# Patient Record
Sex: Female | Born: 1995
Health system: Southern US, Community
[De-identification: ages and names within clinical notes are randomized; demographics above are authoritative.]

## PROBLEM LIST (undated history)

## (undated) DIAGNOSIS — G43909 Migraine, unspecified, not intractable, without status migrainosus: Secondary | ICD-10-CM

## (undated) DIAGNOSIS — R350 Frequency of micturition: Secondary | ICD-10-CM

## (undated) DIAGNOSIS — L309 Dermatitis, unspecified: Secondary | ICD-10-CM

---
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
## 2015-07-08 ENCOUNTER — Emergency Department (HOSPITAL_COMMUNITY)
Admission: EM | Admit: 2015-07-08 | Discharge: 2015-07-08 | Disposition: A | Discharge status: Home / Self Care | Payer: BLUE CROSS/BLUE SHIELD | Attending: Emergency Medicine | Admitting: Emergency Medicine

## 2015-07-08 ENCOUNTER — Encounter (HOSPITAL_COMMUNITY): Payer: Self-pay

## 2015-07-08 DIAGNOSIS — J069 Acute upper respiratory infection, unspecified: Secondary | ICD-10-CM | POA: Insufficient documentation

## 2015-07-08 DIAGNOSIS — Z872 Personal history of diseases of the skin and subcutaneous tissue: Secondary | ICD-10-CM | POA: Insufficient documentation

## 2015-07-08 DIAGNOSIS — R05 Cough: Secondary | ICD-10-CM | POA: Diagnosis present

## 2015-07-08 DIAGNOSIS — Z8679 Personal history of other diseases of the circulatory system: Secondary | ICD-10-CM | POA: Insufficient documentation

## 2015-07-08 DIAGNOSIS — Z880 Allergy status to penicillin: Secondary | ICD-10-CM | POA: Insufficient documentation

## 2015-07-08 MED ORDER — GUAIFENESIN 100 MG/5ML PO LIQD: 100.0000 mg | ORAL | Status: DC | PRN

## 2015-07-08 MED ORDER — IPRATROPIUM BROMIDE 0.06 % NA SOLN: 2.0000 | Freq: Four times a day (QID) | NASAL | Status: DC

## 2015-07-08 MED ORDER — PROMETHAZINE-DM 6.25-15 MG/5ML PO SYRP: 2.5000 mL | ORAL_SOLUTION | Freq: Four times a day (QID) | ORAL | Status: DC | PRN

## 2015-07-08 NOTE — ED Notes (Signed)
Pt reports wet, non-productive cough onset Sunday and sinus infection onset Saturday. OTC meds not working.

## 2015-07-08 NOTE — ED Provider Notes (Signed)
CSN: [id number]     Arrival date & time 07/08/15  1931 History  By signing my name below, I, Elon SpannerGarrett Cook, attest that this documentation has been prepared under the direction and in the presence of Fayrene HelperBowie Camyra Vaeth, PA-C. Electronically Signed: Elon SpannerGarrett Cook ED Scribe. 07/08/2015. 7:47 PM.    Chief Complaint  Patient presents with  . Cough   The history is provided by the patient. No language interpreter was used.   HPI Comments: Katherine Rodgers is a 19 y.o. female who presents to the Emergency Department complaining of an unchanged dry cough onset 5 days ago.  Associated symptoms include scant rhinorrhea, mild voice hoarseness and sore throat.  She has taken Nyquill/Dayquill and Aleve without relief.  She reports a hx of once yearly sinus infections and concern over this brought her to the ED today.  Patient does not smoke.  She denies sinus pain, ear pain, fever, chills, SOB.     Past Medical History  Diagnosis Date  . Migraine   . Eczema   . Urinary frequency    History reviewed. No pertinent past surgical history. No family history on file. Social History  Substance Use Topics  . Smoking status: Never Smoker   . Smokeless tobacco: None  . Alcohol Use: None   OB History    No data available     Review of Systems  Constitutional: Negative for fever.  HENT: Positive for rhinorrhea, sore throat and voice change. Negative for ear pain and sinus pressure.   Respiratory: Positive for cough. Negative for shortness of breath.       Allergies  Amoxicillin  Home Medications   Prior to Admission medications   Medication Sig Start Date End Date Taking? Authorizing Provider  ipratropium (ATROVENT) 0.06 % nasal spray Place 2 sprays into both nostrils 4 (four) times daily. 07/12/14   Rodolph BongEvan S Corey, MD  Norethin-Eth Estradiol-Fe (GENERESS FE PO) Take by mouth.    Historical Provider, MD  predniSONE (DELTASONE) 10 MG tablet Take 3 tablets (30 mg total) by mouth daily. 07/12/14   Rodolph BongEvan S Corey,  MD  traMADol (ULTRAM) 50 MG tablet Take 1 tablet (50 mg total) by mouth at bedtime as needed (cough). 07/12/14   Rodolph BongEvan S Corey, MD   BP 110/75 mmHg  Pulse 79  Temp(Src) 98 F (36.7 C) (Oral)  Resp 18  Ht 5' (1.524 m)  Wt 124 lb (56.246 kg)  BMI 24.22 kg/m2  SpO2 99%  LMP 07/05/2015 Physical Exam  Constitutional: She is oriented to person, place, and time. She appears well-developed and well-nourished. No distress.  HENT:  Head: Normocephalic and atraumatic.  Nose: Nose normal.  Ears nl.  Mild post oropharyngeal erythema no tonsillar enlargement or exudates.  Uvula is midline.    Eyes: Conjunctivae and EOM are normal.  Neck: Neck supple. No tracheal deviation present.  Cardiovascular: Normal rate, regular rhythm and normal heart sounds.   Pulmonary/Chest: Effort normal and breath sounds normal. No respiratory distress. She has no wheezes. She has no rales.  Musculoskeletal: Normal range of motion.  Neurological: She is alert and oriented to person, place, and time.  Skin: Skin is warm and dry.  Psychiatric: She has a normal mood and affect. Her behavior is normal.  Nursing note and vitals reviewed.   ED Course  Procedures (including critical care time)  DIAGNOSTIC STUDIES: Oxygen Saturation is 99% on RA, normal by my interpretation.    COORDINATION OF CARE:  7:46 PM Discussed suspicion of viral etiology  with patient.  Will prescribe Dextromethorphan and guaifenesin.  Discussed advanced imaging options with patient who agreed it was not indicated at this time.  Patient acknowledges and agrees with plan.       MDM   Final diagnoses:  URI (upper respiratory infection)    BP 110/75 mmHg  Pulse 79  Temp(Src) 98 F (36.7 C) (Oral)  Resp 18  Ht 5' (1.524 m)  Wt 124 lb (56.246 kg)  BMI 24.22 kg/m2  SpO2 99%  LMP 07/05/2015   I personally performed the services described in this documentation, which was scribed in my presence. The recorded information has been  reviewed and is accurate.      Fayrene Helper, PA-C 07/08/15 1953  Fayrene Helper, PA-C 07/08/15 1610  Pricilla Loveless, MD 07/11/15 [id number]

## 2015-07-08 NOTE — Discharge Instructions (Signed)
Please take guaifenesin and promethazine as needed for your symptoms.  If no improvement, you may use atrovent nasal spray to help with nasal congestions.  Upper Respiratory Infection, Adult Most upper respiratory infections (URIs) are a viral infection of the air passages leading to the lungs. A URI affects the nose, throat, and upper air passages. The most common type of URI is nasopharyngitis and is typically referred to as "the common cold." URIs run their course and usually go away on their own. Most of the time, a URI does not require medical attention, but sometimes a bacterial infection in the upper airways can follow a viral infection. This is called a secondary infection. Sinus and middle ear infections are common types of secondary upper respiratory infections. Bacterial pneumonia can also complicate a URI. A URI can worsen asthma and chronic obstructive pulmonary disease (COPD). Sometimes, these complications can require emergency medical care and may be life threatening.  CAUSES Almost all URIs are caused by viruses. A virus is a type of germ and can spread from one person to another.  RISKS FACTORS You may be at risk for a URI if:   You smoke.   You have chronic heart or lung disease.  You have a weakened defense (immune) system.   You are very young or very old.   You have nasal allergies or asthma.  You work in crowded or poorly ventilated areas.  You work in health care facilities or schools. SIGNS AND SYMPTOMS  Symptoms typically develop 2-3 days after you come in contact with a cold virus. Most viral URIs last 7-10 days. However, viral URIs from the influenza virus (flu virus) can last 14-18 days and are typically more severe. Symptoms may include:   Runny or stuffy (congested) nose.   Sneezing.   Cough.   Sore throat.   Headache.   Fatigue.   Fever.   Loss of appetite.   Pain in your forehead, behind your eyes, and over your cheekbones (sinus  pain).  Muscle aches.  DIAGNOSIS  Your health care provider may diagnose a URI by:  Physical exam.  Tests to check that your symptoms are not due to another condition such as:  Strep throat.  Sinusitis.  Pneumonia.  Asthma. TREATMENT  A URI goes away on its own with time. It cannot be cured with medicines, but medicines may be prescribed or recommended to relieve symptoms. Medicines may help:  Reduce your fever.  Reduce your cough.  Relieve nasal congestion. HOME CARE INSTRUCTIONS   Take medicines only as directed by your health care provider.   Gargle warm saltwater or take cough drops to comfort your throat as directed by your health care provider.  Use a warm mist humidifier or inhale steam from a shower to increase air moisture. This may make it easier to breathe.  Drink enough fluid to keep your urine clear or pale yellow.   Eat soups and other clear broths and maintain good nutrition.   Rest as needed.   Return to work when your temperature has returned to normal or as your health care provider advises. You may need to stay home longer to avoid infecting others. You can also use a face mask and careful hand washing to prevent spread of the virus.  Increase the usage of your inhaler if you have asthma.   Do not use any tobacco products, including cigarettes, chewing tobacco, or electronic cigarettes. If you need help quitting, ask your health care provider. PREVENTION  The best way to protect yourself from getting a cold is to practice good hygiene.   Avoid oral or hand contact with people with cold symptoms.   Wash your hands often if contact occurs.  There is no clear evidence that vitamin C, vitamin E, echinacea, or exercise reduces the chance of developing a cold. However, it is always recommended to get plenty of rest, exercise, and practice good nutrition.  SEEK MEDICAL CARE IF:   You are getting worse rather than better.   Your symptoms are  not controlled by medicine.   You have chills.  You have worsening shortness of breath.  You have brown or red mucus.  You have yellow or brown nasal discharge.  You have pain in your face, especially when you bend forward.  You have a fever.  You have swollen neck glands.  You have pain while swallowing.  You have white areas in the back of your throat. SEEK IMMEDIATE MEDICAL CARE IF:   You have severe or persistent:  Headache.  Ear pain.  Sinus pain.  Chest pain.  You have chronic lung disease and any of the following:  Wheezing.  Prolonged cough.  Coughing up blood.  A change in your usual mucus.  You have a stiff neck.  You have changes in your:  Vision.  Hearing.  Thinking.  Mood. MAKE SURE YOU:   Understand these instructions.  Will watch your condition.  Will get help right away if you are not doing well or get worse.   This information is not intended to replace advice given to you by your health care provider. Make sure you discuss any questions you have with your health care provider.   Document Released: 02/13/2001 Document Revised: 01/04/2015 Document Reviewed: 11/25/2013 Elsevier Interactive Patient Education [employer].

## 2015-09-30 ENCOUNTER — Encounter (HOSPITAL_COMMUNITY): Payer: Self-pay | Admitting: Emergency Medicine

## 2015-09-30 ENCOUNTER — Emergency Department (INDEPENDENT_AMBULATORY_CARE_PROVIDER_SITE_OTHER)
Admission: EM | Admit: 2015-09-30 | Discharge: 2015-09-30 | Disposition: A | Discharge status: Home / Self Care | Payer: BLUE CROSS/BLUE SHIELD | Source: Home / Self Care | Attending: Family Medicine | Admitting: Family Medicine

## 2015-09-30 DIAGNOSIS — R1033 Periumbilical pain: Secondary | ICD-10-CM

## 2015-09-30 DIAGNOSIS — K5901 Slow transit constipation: Secondary | ICD-10-CM | POA: Diagnosis not present

## 2015-09-30 NOTE — Discharge Instructions (Signed)
Constipation, Adult  Use the Miralax as we discussed, 2 glasses in the evening if needed. Continue til you have empty colon. Read all of these instructions Constipation is when a person has fewer than three bowel movements a week, has difficulty having a bowel movement, or has stools that are dry, hard, or larger than normal. As people grow older, constipation is more common. A low-fiber diet, not taking in enough fluids, and taking certain medicines may make constipation worse.  CAUSES   Certain medicines, such as antidepressants, pain medicine, iron supplements, antacids, and water pills.   Certain diseases, such as diabetes, irritable bowel syndrome (IBS), thyroid disease, or depression.   Not drinking enough water.   Not eating enough fiber-rich foods.   Stress or travel.   Lack of physical activity or exercise.   Ignoring the urge to have a bowel movement.   Using laxatives too much.  SIGNS AND SYMPTOMS   Having fewer than three bowel movements a week.   Straining to have a bowel movement.   Having stools that are hard, dry, or larger than normal.   Feeling full or bloated.   Pain in the lower abdomen.   Not feeling relief after having a bowel movement.  DIAGNOSIS  Your health care provider will take a medical history and perform a physical exam. Further testing may be done for severe constipation. Some tests may include:  A barium enema X-ray to examine your rectum, colon, and, sometimes, your small intestine.   A sigmoidoscopy to examine your lower colon.   A colonoscopy to examine your entire colon. TREATMENT  Treatment will depend on the severity of your constipation and what is causing it. Some dietary treatments include drinking more fluids and eating more fiber-rich foods. Lifestyle treatments may include regular exercise. If these diet and lifestyle recommendations do not help, your health care provider may recommend taking over-the-counter  laxative medicines to help you have bowel movements. Prescription medicines may be prescribed if over-the-counter medicines do not work.  HOME CARE INSTRUCTIONS   Eat foods that have a lot of fiber, such as fruits, vegetables, whole grains, and beans.  Limit foods high in fat and processed sugars, such as french fries, hamburgers, cookies, candies, and soda.   A fiber supplement may be added to your diet if you cannot get enough fiber from foods.   Drink enough fluids to keep your urine clear or pale yellow.   Exercise regularly or as directed by your health care provider.   Go to the restroom when you have the urge to go. Do not hold it.   Only take over-the-counter or prescription medicines as directed by your health care provider. Do not take other medicines for constipation without talking to your health care provider first.  SEEK IMMEDIATE MEDICAL CARE IF:   You have bright red blood in your stool.   Your constipation lasts for more than 4 days or gets worse.   You have abdominal or rectal pain.   You have thin, pencil-like stools.   You have unexplained weight loss. MAKE SURE YOU:   Understand these instructions.  Will watch your condition.  Will get help right away if you are not doing well or get worse.   This information is not intended to replace advice given to you by your health care provider. Make sure you discuss any questions you have with your health care provider.   Document Released: 05/18/2004 Document Revised: 09/10/2014 Document Reviewed: 06/01/2013 Elsevier Interactive Patient  Education 2016 Elsevier Inc.  Abdominal Pain, Adult Many things can cause abdominal pain. Usually, abdominal pain is not caused by a disease and will improve without treatment. It can often be observed and treated at home. Your health care provider will do a physical exam and possibly order blood tests and X-rays to help determine the seriousness of your pain. However,  in many cases, more time must pass before a clear cause of the pain can be found. Before that point, your health care provider may not know if you need more testing or further treatment. HOME CARE INSTRUCTIONS Monitor your abdominal pain for any changes. The following actions may help to alleviate any discomfort you are experiencing:  Only take over-the-counter or prescription medicines as directed by your health care provider.  Do not take laxatives unless directed to do so by your health care provider.  Try a clear liquid diet (broth, tea, or water) as directed by your health care provider. Slowly move to a bland diet as tolerated. SEEK MEDICAL CARE IF:  You have unexplained abdominal pain.  You have abdominal pain associated with nausea or diarrhea.  You have pain when you urinate or have a bowel movement.  You experience abdominal pain that wakes you in the night.  You have abdominal pain that is worsened or improved by eating food.  You have abdominal pain that is worsened with eating fatty foods.  You have a fever. SEEK IMMEDIATE MEDICAL CARE IF:  Your pain does not go away within 2 hours.  You keep throwing up (vomiting).  Your pain is felt only in portions of the abdomen, such as the right side or the left lower portion of the abdomen.  You pass bloody or black tarry stools. MAKE SURE YOU:  Understand these instructions.  Will watch your condition.  Will get help right away if you are not doing well or get worse.   This information is not intended to replace advice given to you by your health care provider. Make sure you discuss any questions you have with your health care provider.   Document Released: 05/30/2005 Document Revised: 05/11/2015 Document Reviewed: 04/29/2013 Elsevier Interactive Patient Education 2016 Elsevier Inc.  High-Fiber Diet Fiber, also called dietary fiber, is a type of carbohydrate found in fruits, vegetables, whole grains, and beans. A  high-fiber diet can have many health benefits. Your health care provider may recommend a high-fiber diet to help:  Prevent constipation. Fiber can make your bowel movements more regular.  Lower your cholesterol.  Relieve hemorrhoids, uncomplicated diverticulosis, or irritable bowel syndrome.  Prevent overeating as part of a weight-loss plan.  Prevent heart disease, type 2 diabetes, and certain cancers. WHAT IS MY PLAN? The recommended daily intake of fiber includes:  38 grams for men under age 22.  30 grams for men over age 12.  25 grams for women under age 47.  21 grams for women over age 77. You can get the recommended daily intake of dietary fiber by eating a variety of fruits, vegetables, grains, and beans. Your health care provider may also recommend a fiber supplement if it is not possible to get enough fiber through your diet. WHAT DO I NEED TO KNOW ABOUT A HIGH-FIBER DIET?  Fiber supplements have not been widely studied for their effectiveness, so it is better to get fiber through food sources.  Always check the fiber content on thenutrition facts label of any prepackaged food. Look for foods that contain at least 5 grams of  fiber per serving.  Ask your dietitian if you have questions about specific foods that are related to your condition, especially if those foods are not listed in the following section.  Increase your daily fiber consumption gradually. Increasing your intake of dietary fiber too quickly may cause bloating, cramping, or gas.  Drink plenty of water. Water helps you to digest fiber. WHAT FOODS CAN I EAT? Grains Whole-grain breads. Multigrain cereal. Oats and oatmeal. Brown rice. Barley. Bulgur wheat. Millet. Bran muffins. Popcorn. Rye wafer crackers. Vegetables Sweet potatoes. Spinach. Kale. Artichokes. Cabbage. Broccoli. Green peas. Carrots. Squash. Fruits Berries. Pears. Apples. Oranges. Avocados. Prunes and raisins. Dried figs. Meats and Other  Protein Sources Navy, kidney, pinto, and soy beans. Split peas. Lentils. Nuts and seeds. Dairy Fiber-fortified yogurt. Beverages Fiber-fortified soy milk. Fiber-fortified orange juice. Other Fiber bars. The items listed above may not be a complete list of recommended foods or beverages. Contact your dietitian for more options. WHAT FOODS ARE NOT RECOMMENDED? Grains White bread. Pasta made with refined flour. White rice. Vegetables Fried potatoes. Canned vegetables. Well-cooked vegetables.  Fruits Fruit juice. Cooked, strained fruit. Meats and Other Protein Sources Fatty cuts of meat. Fried Environmental education officer or fried fish. Dairy Milk. Yogurt. Cream cheese. Sour cream. Beverages Soft drinks. Other Cakes and pastries. Butter and oils. The items listed above may not be a complete list of foods and beverages to avoid. Contact your dietitian for more information. WHAT ARE SOME TIPS FOR INCLUDING HIGH-FIBER FOODS IN MY DIET?  Eat a wide variety of high-fiber foods.  Make sure that half of all grains consumed each day are whole grains.  Replace breads and cereals made from refined flour or white flour with whole-grain breads and cereals.  Replace white rice with brown rice, bulgur wheat, or millet.  Start the day with a breakfast that is high in fiber, such as a cereal that contains at least 5 grams of fiber per serving.  Use beans in place of meat in soups, salads, or pasta.  Eat high-fiber snacks, such as berries, raw vegetables, nuts, or popcorn.   This information is not intended to replace advice given to you by your health care provider. Make sure you discuss any questions you have with your health care provider.   Document Released: 08/20/2005 Document Revised: 09/10/2014 Document Reviewed: 02/02/2014 Elsevier Interactive Patient Education [employer]

## 2015-09-30 NOTE — ED Provider Notes (Signed)
CSN: [id number]     Arrival date & time 09/30/15  1847 History   First MD Initiated Contact with Patient 09/30/15 1908     Chief Complaint  Patient presents with  . Abdominal Pain  . Constipation   (Consider location/radiation/quality/duration/timing/severity/associated sxs/prior Treatment) HPI Comments: 20 year old female presents with abdominal pain intermittently since December. She was apparently administered an unknown type of pain medicine at that time for toothache pain. She has taken the medicine periodically for toothache. Since that time she has had a cramping pain across the mid abdomen. She has bowel movements every 2-3 days. Sometime she strains and has pain with defecation. Occasionally has nausea but no vomiting. No bleeding. She is currently having her menses. The previous menses was January 1. Denies pelvic pain, vaginal discharge.  Patient is a 20 y.o. female presenting with abdominal pain and constipation.  Abdominal Pain Associated symptoms: constipation, nausea and vaginal bleeding   Associated symptoms: no fatigue, no fever, no vaginal discharge and no vomiting   Constipation Associated symptoms: abdominal pain and nausea   Associated symptoms: no fever and no vomiting     Past Medical History  Diagnosis Date  . Migraine   . Eczema   . Urinary frequency    History reviewed. No pertinent past surgical history. History reviewed. No pertinent family history. Social History  Substance Use Topics  . Smoking status: Never Smoker   . Smokeless tobacco: None  . Alcohol Use: None   OB History    No data available     Review of Systems  Constitutional: Negative for fever, activity change and fatigue.  HENT: Negative.   Respiratory: Negative.   Cardiovascular: Negative.   Gastrointestinal: Positive for nausea, abdominal pain and constipation. Negative for vomiting, blood in stool and anal bleeding.  Genitourinary: Positive for frequency and vaginal bleeding.  Negative for vaginal discharge, vaginal pain and pelvic pain.  Skin: Negative.   All other systems reviewed and are negative.   Allergies  Amoxicillin  Home Medications   Prior to Admission medications   Medication Sig Start Date End Date Taking? Authorizing Provider  Norethin-Eth Estradiol-Fe (GENERESS FE PO) Take by mouth.   Yes Historical Provider, MD  guaiFENesin (ROBITUSSIN) 100 MG/5ML liquid Take 5-10 mLs (100-200 mg total) by mouth every 4 (four) hours as needed for cough or congestion. 07/08/15   Fayrene Helper, PA-C  ipratropium (ATROVENT) 0.06 % nasal spray Place 2 sprays into both nostrils 4 (four) times daily. 07/08/15   Fayrene Helper, PA-C  predniSONE (DELTASONE) 10 MG tablet Take 3 tablets (30 mg total) by mouth daily. 07/12/14   Rodolph Bong, MD  promethazine-dextromethorphan (PROMETHAZINE-DM) 6.25-15 MG/5ML syrup Take 2.5 mLs by mouth 4 (four) times daily as needed for cough. 07/08/15   Fayrene Helper, PA-C  traMADol (ULTRAM) 50 MG tablet Take 1 tablet (50 mg total) by mouth at bedtime as needed (cough). 07/12/14   Rodolph Bong, MD   Meds Ordered and Administered this Visit  Medications - No data to display  BP 118/80 mmHg  Pulse 68  Temp(Src) 98.6 F (37 C) (Oral)  Resp 16  SpO2 100%  LMP 09/30/2015 (Exact Date) No data found.   Physical Exam  Constitutional: She is oriented to person, place, and time. She appears well-developed and well-nourished. No distress.  Eyes: EOM are normal.  Neck: Normal range of motion. Neck supple.  Cardiovascular: Normal rate, regular rhythm and normal heart sounds.   Pulmonary/Chest: Effort normal and breath sounds normal. No  respiratory distress.  Abdominal: Soft. Bowel sounds are normal.  Minor distention across the mid abdomen. The epigastrium percusses tympanic. The left hemiabdomen and periumbilical areas percuss flat her goal. No rebound or guarding. No peritoneal signs. No tenderness across the anterior pelvis.  Musculoskeletal: She  exhibits no edema.  Neurological: She is alert and oriented to person, place, and time. She exhibits normal muscle tone.  Skin: Skin is warm and dry. No rash noted.  Psychiatric: She has a normal mood and affect.  Nursing note and vitals reviewed.   ED Course  Procedures (including critical care time)  Labs Review Labs Reviewed - No data to display  Imaging Review No results found.   Visual Acuity Review  Right Eye Distance:   Left Eye Distance:   Bilateral Distance:    Right Eye Near:   Left Eye Near:    Bilateral Near:         MDM   1. Periumbilical abdominal pain   2. Constipation by delayed colonic transit    Use the Miralax as we discussed, 2 glasses in the evening if needed. Continue til you have empty colon. Read all of these instructions Increase fiber, less fast foods.    Hayden Rasmussen, NP 09/30/15 1952

## 2015-09-30 NOTE — ED Notes (Signed)
The patient presented to the [facility] with a complaint of abdominal pain and possible constipation. The patient stated that she saw her campus physician and he advised her to take a laxative. She stated that she did and the pain increased. The patient stated that she is having bowel movements.

## 2015-11-17 ENCOUNTER — Ambulatory Visit (INDEPENDENT_AMBULATORY_CARE_PROVIDER_SITE_OTHER): Payer: BLUE CROSS/BLUE SHIELD | Admitting: Certified Nurse Midwife

## 2015-11-17 ENCOUNTER — Telehealth: Payer: Self-pay | Admitting: Certified Nurse Midwife

## 2015-11-17 ENCOUNTER — Encounter: Payer: Self-pay | Admitting: Certified Nurse Midwife

## 2015-11-17 VITALS — BP 116/83 | HR 83 | Temp 98.3°F | Wt 119.0 lb

## 2015-11-17 DIAGNOSIS — Z01419 Encounter for gynecological examination (general) (routine) without abnormal findings: Secondary | ICD-10-CM | POA: Diagnosis not present

## 2015-11-17 DIAGNOSIS — Z3041 Encounter for surveillance of contraceptive pills: Secondary | ICD-10-CM

## 2015-11-17 DIAGNOSIS — N760 Acute vaginitis: Secondary | ICD-10-CM

## 2015-11-17 DIAGNOSIS — B9689 Other specified bacterial agents as the cause of diseases classified elsewhere: Secondary | ICD-10-CM

## 2015-11-17 MED ORDER — NORETHIN-ETH ESTRADIOL-FE 0.8-25 MG-MCG PO CHEW: 1.0000 | CHEWABLE_TABLET | Freq: Every day | ORAL | Status: DC

## 2015-11-17 MED ORDER — TERCONAZOLE 0.4 % VA CREA
1.0000 | TOPICAL_CREAM | Freq: Every day | VAGINAL | Status: DC
Start: 2015-11-17 — End: 2016-06-14

## 2015-11-17 MED ORDER — FLUCONAZOLE 100 MG PO TABS: 100.0000 mg | ORAL_TABLET | Freq: Once | ORAL | Status: DC

## 2015-11-17 MED ORDER — CLINDAMYCIN PHOSPHATE 100 MG VA SUPP: 100.0000 mg | Freq: Every day | VAGINAL | Status: DC

## 2015-11-17 NOTE — Progress Notes (Signed)
Patient ID: Katherine Rodgers Pothier, female   DOB: 07/30/1996, 20 y.o.   MRN: [id number]   Subjective:      Katherine Rodgers Baena is a 20 y.o. female here for a routine exam.  Current complaints:  BV, tried metrogel from campus center.  Last taken last Tuesday.  Menses are irregular, lasting 3-4 days, denies any heavy bleeding, clots or cramping.  Currently sexually active.  chewable OCP and condoms.    Sees Dr. Horris Latinoandolf for primary care.    Personal health questionnaire:  Is patient Ashkenazi Jewish, have a family history of breast and/or ovarian cancer: no Is there a family history of uterine cancer diagnosed at age < 950, gastrointestinal cancer, urinary tract cancer, family member who is a Personnel officerLynch syndrome-associated carrier: no Is the patient overweight and hypertensive, family history of diabetes, personal history of gestational diabetes, preeclampsia or PCOS: no Is patient over 7255, have PCOS,  family history of premature CHD under age 20, diabetes, smoke, have hypertension or peripheral artery disease:  yes At any time, has a partner hit, kicked or otherwise hurt or frightened you?: no Over the past 2 weeks, have you felt down, depressed or hopeless?: no Over the past 2 weeks, have you felt little interest or pleasure in doing things?:no   Gynecologic History Patient's last menstrual period was 10/28/2015. Contraception: condoms and OCP (estrogen/progesterone) Last Pap: N/A.  Last mammogram: N/A.   Obstetric History OB History  No data available    Past Medical History  Diagnosis Date  . Migraine   . Eczema   . Urinary frequency     History reviewed. No pertinent past surgical history.   Current outpatient prescriptions:  .  guaiFENesin (ROBITUSSIN) 100 MG/5ML liquid, Take 5-10 mLs (100-200 mg total) by mouth every 4 (four) hours as needed for cough or congestion., Disp: 60 mL, Rfl: 0 .  Loratadine-Pseudoephedrine (CLARITIN-D 12 HOUR PO), Take by mouth., Disp: , Rfl:  .  Norethindrone-Ethinyl  Estradiol-Fe (GENERESS FE) 0.8-25 MG-MCG tablet, Chew 1 tablet by mouth daily., Disp: 1 Package, Rfl: 11 .  clindamycin (CLEOCIN) 100 MG vaginal suppository, Place 1 suppository (100 mg total) vaginally at bedtime., Disp: 6 suppository, Rfl: 6 .  fluconazole (DIFLUCAN) 100 MG tablet, Take 1 tablet (100 mg total) by mouth once. Repeat dose in 48-72 hour., Disp: 3 tablet, Rfl: 3 .  ipratropium (ATROVENT) 0.06 % nasal spray, Place 2 sprays into both nostrils 4 (four) times daily. (Patient not taking: Reported on 11/17/2015), Disp: 15 mL, Rfl: 0 .  terconazole (TERAZOL 7) 0.4 % vaginal cream, Place 1 applicator vaginally at bedtime., Disp: 45 g, Rfl: 3 Allergies  Allergen Reactions  . Augmentin [Amoxicillin-Pot Clavulanate] Nausea And Vomiting    Social History  Substance Use Topics  . Smoking status: Never Smoker   . Smokeless tobacco: Not on file  . Alcohol Use: No    Family History  Problem Relation Age of Onset  . Hypertension Mother       Review of Systems  Constitutional: negative for fatigue and weight loss Respiratory: negative for cough and wheezing Cardiovascular: negative for chest pain, fatigue and palpitations Gastrointestinal: negative for abdominal pain and change in bowel habits Musculoskeletal:negative for myalgias Neurological: negative for gait problems and tremors Behavioral/Psych: negative for abusive relationship, depression Endocrine: negative for temperature intolerance   Genitourinary:negative for abnormal menstrual periods, genital lesions, hot flashes, sexual problems and vaginal discharge Integument/breast: negative for breast lump, breast tenderness, nipple discharge and skin lesion(s)    Objective:  BP 116/83 mmHg  Pulse 83  Temp(Src) 98.3 F (36.8 C)  Wt 119 lb (53.978 kg)  LMP 10/28/2015 General:   alert  Skin:   no rash or abnormalities  Lungs:   clear to auscultation bilaterally  Heart:   regular rate and rhythm, S1, S2 normal, no  murmur, click, rub or gallop  Breasts:   normal without suspicious masses, skin or nipple changes or axillary nodes  Abdomen:  normal findings: no organomegaly, soft, non-tender and no hernia  Pelvis:  External genitalia: normal general appearance Urinary system: urethral meatus normal and bladder without fullness, nontender Vaginal: normal without tenderness, induration or masses Cervix: normal appearance Adnexa: normal bimanual exam Uterus: anteverted and non-tender, normal size   Lab Review Urine pregnancy test Labs reviewed yes Radiologic studies reviewed no  50% of 30 min visit spent on counseling and coordination of care.   Assessment:    Healthy female exam.   BV infection  contraception management  Plan:    Education reviewed: calcium supplements, depression evaluation, low fat, low cholesterol diet, safe sex/STD prevention, self breast exams, skin cancer screening and weight bearing exercise. Contraception: condoms and OCP (estrogen/progesterone). Follow up in: 1 year.   Meds ordered this encounter  Medications  . Loratadine-Pseudoephedrine (CLARITIN-D 12 HOUR PO)    Sig: Take by mouth.  . clindamycin (CLEOCIN) 100 MG vaginal suppository    Sig: Place 1 suppository (100 mg total) vaginally at bedtime.    Dispense:  6 suppository    Refill:  6  . fluconazole (DIFLUCAN) 100 MG tablet    Sig: Take 1 tablet (100 mg total) by mouth once. Repeat dose in 48-72 hour.    Dispense:  3 tablet    Refill:  3  . terconazole (TERAZOL 7) 0.4 % vaginal cream    Sig: Place 1 applicator vaginally at bedtime.    Dispense:  45 g    Refill:  3  . Norethindrone-Ethinyl Estradiol-Fe (GENERESS FE) 0.8-25 MG-MCG tablet    Sig: Chew 1 tablet by mouth daily.    Dispense:  1 Package    Refill:  11   No orders of the defined types were placed in this encounter.   Need to obtain previous records Possible management options include: LARK Follow up as needed.

## 2015-11-17 NOTE — Telephone Encounter (Signed)
Pt was wondering how she would know if the medicine was working if she didn't have any symptoms to begin with. Please advise

## 2015-11-21 ENCOUNTER — Telehealth: Payer: Self-pay | Admitting: *Deleted

## 2015-11-21 NOTE — Telephone Encounter (Signed)
Patient has questions about how to take her medications Rachelle sent in for her. Told patient I would have Rachelle call her with instructions tomorrow. Patient is in class until 2:00 tomorrow [phone]. Did review normal vaginal flora and how over growth can happen- patient states she was asymptomatic but now has slight discharge.

## 2015-11-22 ENCOUNTER — Other Ambulatory Visit: Payer: Self-pay | Admitting: Certified Nurse Midwife

## 2015-11-22 NOTE — Telephone Encounter (Signed)
Spoke with patient regarding medications.  States that she is doing better.  Desires to use the Flagyl, Diflucan and wait on Cleocin.  Patient verbalized understanding.  R.Aeriel Boulay CNM

## 2015-11-28 ENCOUNTER — Telehealth: Payer: Self-pay | Admitting: Certified Nurse Midwife

## 2015-11-28 NOTE — Telephone Encounter (Signed)
Pt called and stated that after she finished her treatment, she still with dry mouth as a side effect. Pt will like to know what to do? Also Pt will like to know if the treatment was effective and if she is good now.  Pt appreciate if you can call her.   Katherine Rodgers

## 2015-11-29 ENCOUNTER — Other Ambulatory Visit: Payer: Self-pay | Admitting: Certified Nurse Midwife

## 2015-11-29 NOTE — Progress Notes (Signed)
Discussed POC with patient.  Patient had concerns regarding her vaginal discharge.  Just completed her period.  Denies any odor or itching.  Encouraged patient to f/u in 1 month if she feels like she is having symptoms.  Patient verbalized understanding.   R.Sheliah Fiorillo CNM

## 2016-01-05 DIAGNOSIS — J0300 Acute streptococcal tonsillitis, unspecified: Secondary | ICD-10-CM | POA: Diagnosis not present

## 2016-01-12 ENCOUNTER — Telehealth: Payer: Self-pay | Admitting: *Deleted

## 2016-01-12 NOTE — Telephone Encounter (Signed)
Patient is having BTB on OCP and wants to know if she needs to come in- call message taken by [place]. 12:30 Call to patient - patient is being treated for strep throat and she was given a course of antibiotics. Patient started having BTB after starting the antibiotic therapy. Patient wants to know if that is normal. Reviewed SE of antibiotics on OCP- encouraged patient to use BUM if she is active- which she has not been in 3 months. She also had questions about BV and yeast. Questions answered and patient satisfied.

## 2016-01-16 DIAGNOSIS — Z3009 Encounter for other general counseling and advice on contraception: Secondary | ICD-10-CM | POA: Diagnosis not present

## 2016-02-29 DIAGNOSIS — N399 Disorder of urinary system, unspecified: Secondary | ICD-10-CM | POA: Diagnosis not present

## 2016-02-29 DIAGNOSIS — R51 Headache: Secondary | ICD-10-CM | POA: Diagnosis not present

## 2016-03-22 DIAGNOSIS — R51 Headache: Secondary | ICD-10-CM | POA: Diagnosis not present

## 2016-04-03 DIAGNOSIS — Z0000 Encounter for general adult medical examination without abnormal findings: Secondary | ICD-10-CM | POA: Diagnosis not present

## 2016-04-03 DIAGNOSIS — Z01411 Encounter for gynecological examination (general) (routine) with abnormal findings: Secondary | ICD-10-CM | POA: Diagnosis not present

## 2016-04-03 DIAGNOSIS — Z3202 Encounter for pregnancy test, result negative: Secondary | ICD-10-CM | POA: Diagnosis not present

## 2016-04-03 DIAGNOSIS — Z113 Encounter for screening for infections with a predominantly sexual mode of transmission: Secondary | ICD-10-CM | POA: Diagnosis not present

## 2016-05-21 DIAGNOSIS — H6690 Otitis media, unspecified, unspecified ear: Secondary | ICD-10-CM | POA: Diagnosis not present

## 2016-05-23 DIAGNOSIS — J0300 Acute streptococcal tonsillitis, unspecified: Secondary | ICD-10-CM | POA: Diagnosis not present

## 2016-06-02 DIAGNOSIS — H60311 Diffuse otitis externa, right ear: Secondary | ICD-10-CM | POA: Diagnosis not present

## 2016-06-13 ENCOUNTER — Telehealth: Payer: Self-pay

## 2016-06-13 NOTE — Telephone Encounter (Signed)
S/w the patient about menstrual cycle while on birth control and antibiotics. Patient stated that she would like to come in and speak to provider about medication. Transferred to make an appt.

## 2016-06-14 ENCOUNTER — Ambulatory Visit (INDEPENDENT_AMBULATORY_CARE_PROVIDER_SITE_OTHER): Payer: BLUE CROSS/BLUE SHIELD | Admitting: Obstetrics

## 2016-06-14 ENCOUNTER — Encounter: Payer: Self-pay | Admitting: Obstetrics

## 2016-06-14 VITALS — BP 116/83 | HR 79 | Ht 60.0 in | Wt 118.0 lb

## 2016-06-14 DIAGNOSIS — N939 Abnormal uterine and vaginal bleeding, unspecified: Secondary | ICD-10-CM

## 2016-06-14 DIAGNOSIS — Z3202 Encounter for pregnancy test, result negative: Secondary | ICD-10-CM

## 2016-06-14 DIAGNOSIS — Z113 Encounter for screening for infections with a predominantly sexual mode of transmission: Secondary | ICD-10-CM | POA: Diagnosis not present

## 2016-06-14 LAB — POCT URINE PREGNANCY: Preg Test, Ur: NEGATIVE

## 2016-06-15 ENCOUNTER — Encounter: Payer: Self-pay | Admitting: Obstetrics

## 2016-06-15 NOTE — Progress Notes (Signed)
Patient ID: Cory MunchLasha Janice, female   DOB: 05/27/1996, 20 y.o.   MRN: [id number]  Chief Complaint  Patient presents with  . Menstrual Problem    HPI Cory MunchLasha Files is a 20 y.o. female.  Recent OCP start and c/o AUB with 1st cycle. HPI  Past Medical History:  Diagnosis Date  . Eczema   . Migraine   . Urinary frequency     History reviewed. No pertinent surgical history.  Family History  Problem Relation Age of Onset  . Hypertension Mother     Social History Social History  Substance Use Topics  . Smoking status: Never Smoker  . Smokeless tobacco: Not on file  . Alcohol use No    Allergies  Allergen Reactions  . Augmentin [Amoxicillin-Pot Clavulanate] Nausea And Vomiting    Current Outpatient Prescriptions  Medication Sig Dispense Refill  . Norethindrone-Ethinyl Estradiol-Fe (GENERESS FE) 0.8-25 MG-MCG tablet Chew 1 tablet by mouth daily. 1 Package 11   No current facility-administered medications for this visit.     Review of Systems Review of Systems Constitutional: negative for fatigue and weight loss Respiratory: negative for cough and wheezing Cardiovascular: negative for chest pain, fatigue and palpitations Gastrointestinal: negative for abdominal pain and change in bowel habits Genitourinary:positive for AUB on OCP's Integument/breast: negative for nipple discharge Musculoskeletal:negative for myalgias Neurological: negative for gait problems and tremors Behavioral/Psych: negative for abusive relationship, depression Endocrine: negative for temperature intolerance     Blood pressure 116/83, pulse 79, height 5' (1.524 m), weight 118 lb (53.5 kg), last menstrual period 06/08/2016.  Physical Exam Physical Exam General:   alert  Skin:   no rash or abnormalities  Lungs:   clear to auscultation bilaterally  Heart:   regular rate and rhythm, S1, S2 normal, no murmur, click, rub or gallop  Breasts:   normal without suspicious masses, skin or nipple changes or  axillary nodes  Abdomen:  normal findings: no organomegaly, soft, non-tender and no hernia  Pelvis:  External genitalia: normal general appearance Urinary system: urethral meatus normal and bladder without fullness, nontender Vaginal: normal without tenderness, induration or masses Cervix: normal appearance Adnexa: normal bimanual exam Uterus: anteverted and non-tender, normal size    50% of 15 min visit spent on counseling and coordination of care.   Data Reviewed Labs  Assessment     AUB - Hormonal    Plan    Wet prep and GC/Chlamydia cultures done Continue OCP's F/U 6 weeks  Orders Placed This Encounter  Procedures  . POCT urine pregnancy   No orders of the defined types were placed in this encounter.

## 2016-06-18 LAB — CERVICOVAGINAL ANCILLARY ONLY
CHLAMYDIA, DNA PROBE: NEGATIVE
NEISSERIA GONORRHEA: NEGATIVE
TRICH (WINDOWPATH): NEGATIVE

## 2016-06-20 LAB — CERVICOVAGINAL ANCILLARY ONLY
BACTERIAL VAGINITIS: NEGATIVE
Candida vaginitis: NEGATIVE

## 2016-07-30 ENCOUNTER — Institutional Professional Consult (permissible substitution): Payer: BLUE CROSS/BLUE SHIELD | Admitting: Obstetrics

## 2016-08-07 ENCOUNTER — Ambulatory Visit (INDEPENDENT_AMBULATORY_CARE_PROVIDER_SITE_OTHER): Payer: BLUE CROSS/BLUE SHIELD | Admitting: Obstetrics

## 2016-08-07 ENCOUNTER — Encounter: Payer: Self-pay | Admitting: Obstetrics

## 2016-08-07 VITALS — BP 97/72 | HR 90 | Temp 98.2°F | Wt 118.9 lb

## 2016-08-07 DIAGNOSIS — N898 Other specified noninflammatory disorders of vagina: Secondary | ICD-10-CM | POA: Diagnosis not present

## 2016-08-07 DIAGNOSIS — N939 Abnormal uterine and vaginal bleeding, unspecified: Secondary | ICD-10-CM | POA: Diagnosis not present

## 2016-08-07 DIAGNOSIS — Z113 Encounter for screening for infections with a predominantly sexual mode of transmission: Secondary | ICD-10-CM | POA: Diagnosis not present

## 2016-08-08 ENCOUNTER — Encounter: Payer: Self-pay | Admitting: Obstetrics

## 2016-08-08 LAB — HEPATITIS B SURFACE ANTIGEN: Hepatitis B Surface Ag: NEGATIVE

## 2016-08-08 LAB — RPR: RPR: NONREACTIVE

## 2016-08-08 LAB — HEPATITIS C ANTIBODY: HEP C VIRUS AB: 0.1 {s_co_ratio} (ref 0.0–0.9)

## 2016-08-08 LAB — HIV ANTIBODY (ROUTINE TESTING W REFLEX): HIV Screen 4th Generation wRfx: NONREACTIVE

## 2016-08-08 NOTE — Progress Notes (Signed)
Subjective:    Katherine Rodgers is a 20 y.o. female who presents for contraception counseling. The patient has history of irregular bleeding with OCP's. The patient is sexually active. Pertinent past medical history: none.  The information documented in the HPI was reviewed and verified.  Menstrual History: OB History    No data available      Patient's last menstrual period was 08/03/2016 (approximate).   Patient Active Problem List   Diagnosis Date Noted  . Well woman exam 11/17/2015   Past Medical History:  Diagnosis Date  . Eczema   . Migraine   . Urinary frequency     History reviewed. No pertinent surgical history.   Current Outpatient Prescriptions:  .  Norethindrone-Ethinyl Estradiol-Fe (GENERESS FE) 0.8-25 MG-MCG tablet, Chew 1 tablet by mouth daily., Disp: 1 Package, Rfl: 11 Allergies  Allergen Reactions  . Augmentin [Amoxicillin-Pot Clavulanate] Nausea And Vomiting    Social History  Substance Use Topics  . Smoking status: Never Smoker  . Smokeless tobacco: Never Used  . Alcohol use No    Family History  Problem Relation Age of Onset  . Hypertension Mother        Review of Systems Constitutional: negative for weight loss Genitourinary:negative for abnormal menstrual periods and vaginal discharge   Objective:   BP 97/72   Pulse 90   Temp 98.2 F (36.8 C) (Oral)   Wt 118 lb 14.4 oz (53.9 kg)   LMP 08/03/2016 (Approximate)   BMI 23.22 kg/m    General:   alert  Skin:   no rash or abnormalities  Lungs:   clear to auscultation bilaterally  Heart:   regular rate and rhythm, S1, S2 normal, no murmur, click, rub or gallop  Breasts:   normal without suspicious masses, skin or nipple changes or axillary nodes  Abdomen:  normal findings: no organomegaly, soft, non-tender and no hernia  Pelvis:  External genitalia: normal general appearance Urinary system: urethral meatus normal and bladder without fullness, nontender Vaginal: normal without tenderness,  induration or masses Cervix: normal appearance Adnexa: normal bimanual exam Uterus: anteverted and non-tender, normal size   Lab Review Urine pregnancy test Labs reviewed yes Radiologic studies reviewed no  50% of 15 min visit spent on counseling and coordination of care.    Assessment:    20 y.o., continuing OCP (estrogen/progesterone), no contraindications.   Plan:    All questions answered. Chlamydia specimen. Contraception: OCP (estrogen/progesterone). Follow up in 4 months. GC specimen. Wet prep.    No orders of the defined types were placed in this encounter.  Orders Placed This Encounter  Procedures  . HIV antibody  . Hepatitis B surface antigen  . RPR  . Hepatitis C antibody

## 2016-08-09 ENCOUNTER — Institutional Professional Consult (permissible substitution): Payer: BLUE CROSS/BLUE SHIELD | Admitting: Obstetrics

## 2016-08-10 ENCOUNTER — Telehealth: Payer: Self-pay

## 2016-08-10 NOTE — Telephone Encounter (Signed)
Patient called in for lab results  ?

## 2016-08-13 LAB — NUSWAB VG+, CANDIDA 6SP
Candida albicans, NAA: NEGATIVE
Candida glabrata, NAA: NEGATIVE
Candida krusei, NAA: NEGATIVE
Candida lusitaniae, NAA: NEGATIVE
Candida parapsilosis, NAA: NEGATIVE
Candida tropicalis, NAA: NEGATIVE
Chlamydia trachomatis, NAA: NEGATIVE
NEISSERIA GONORRHOEAE, NAA: NEGATIVE
Trich vag by NAA: NEGATIVE

## 2016-09-07 DIAGNOSIS — L700 Acne vulgaris: Secondary | ICD-10-CM | POA: Diagnosis not present

## 2016-09-25 ENCOUNTER — Ambulatory Visit (INDEPENDENT_AMBULATORY_CARE_PROVIDER_SITE_OTHER): Payer: BLUE CROSS/BLUE SHIELD

## 2016-09-25 ENCOUNTER — Ambulatory Visit (INDEPENDENT_AMBULATORY_CARE_PROVIDER_SITE_OTHER): Payer: BLUE CROSS/BLUE SHIELD | Admitting: Family Medicine

## 2016-09-25 VITALS — BP 102/76 | HR 81 | Temp 97.9°F | Resp 16 | Ht 59.0 in | Wt 118.6 lb

## 2016-09-25 DIAGNOSIS — M79632 Pain in left forearm: Secondary | ICD-10-CM | POA: Diagnosis not present

## 2016-09-25 DIAGNOSIS — M549 Dorsalgia, unspecified: Secondary | ICD-10-CM

## 2016-09-25 DIAGNOSIS — M545 Low back pain: Secondary | ICD-10-CM | POA: Diagnosis not present

## 2016-09-25 DIAGNOSIS — M79605 Pain in left leg: Secondary | ICD-10-CM | POA: Diagnosis not present

## 2016-09-25 DIAGNOSIS — M79602 Pain in left arm: Secondary | ICD-10-CM

## 2016-09-25 DIAGNOSIS — S4992XA Unspecified injury of left shoulder and upper arm, initial encounter: Secondary | ICD-10-CM | POA: Diagnosis not present

## 2016-09-25 DIAGNOSIS — S59912A Unspecified injury of left forearm, initial encounter: Secondary | ICD-10-CM | POA: Diagnosis not present

## 2016-09-25 DIAGNOSIS — S79912A Unspecified injury of left hip, initial encounter: Secondary | ICD-10-CM | POA: Diagnosis not present

## 2016-09-25 DIAGNOSIS — M25552 Pain in left hip: Secondary | ICD-10-CM | POA: Diagnosis not present

## 2016-09-25 MED ORDER — NAPROXEN 500 MG PO TABS: 500.0000 mg | ORAL_TABLET | Freq: Two times a day (BID) | ORAL | 0 refills | Status: DC

## 2016-09-25 MED ORDER — PREDNISONE 20 MG PO TABS: ORAL_TABLET | ORAL | 0 refills | Status: DC

## 2016-09-25 MED ORDER — TRAMADOL HCL 50 MG PO TABS: 50.0000 mg | ORAL_TABLET | Freq: Three times a day (TID) | ORAL | 0 refills | Status: DC | PRN

## 2016-09-25 NOTE — Progress Notes (Signed)
Patient ID: Katherine Rodgers, female    DOB: 06-16-1996, 20 y.o.   MRN: [id number]  PCP: Pcp Not In System  Chief Complaint  Patient presents with  . Arm Pain    from a fall on ice yesterday  . Leg Pain    Subjective:  HPI 21 year old female presents for evaluation of arm pain and leg pain x 1 day. Reports that she was walking and slipped on ice falling on her left hip and left arm. Experienced a immediate pain after fall.  Report some tingling of the lower forearm. Pain is most pronounced in lateral left thigh. HTook an 800 mg ibupofen without releif of pain  Lower back is also starting to hurt more now. Back is stiff and denies muscle spasms.  Social History   Social History  . Marital status: Single    Spouse name: N/A  . Number of children: N/A  . Years of education: N/A   Occupational History  . Not on file.   Social History Main Topics  . Smoking status: Never Smoker  . Smokeless tobacco: Never Used  . Alcohol use No  . Drug use: No  . Sexual activity: Yes    Partners: Male    Birth control/ protection: Pill   Other Topics Concern  . Not on file   Social History Narrative  . No narrative on file    Family History  Problem Relation Age of Onset  . Hypertension Mother    Review of Systems See HPI  Prior to Admission medications   Medication Sig Start Date End Date Taking? Authorizing Provider  Norethindrone-Ethinyl Estradiol-Fe (GENERESS FE) 0.8-25 MG-MCG tablet Chew 1 tablet by mouth daily. 11/17/15  Yes Roe Coombs, CNM    Past Medical, Surgical Family and Social History reviewed and updated.    Objective:   Today's Vitals   09/25/16 1546  BP: 102/76  Pulse: 81  Resp: 16  Temp: 97.9 F (36.6 C)  TempSrc: Oral  SpO2: 100%  Weight: 118 lb 9.6 oz (53.8 kg)  Height: 4\' 11"  (1.499 m)    Wt Readings from Last 3 Encounters:  09/25/16 118 lb 9.6 oz (53.8 kg)  08/07/16 118 lb 14.4 oz (53.9 kg)  06/14/16 118 lb (53.5 kg)    Physical  Exam  Constitutional: She is oriented to person, place, and time. She appears well-developed and well-nourished.  Cardiovascular: Normal rate, regular rhythm, normal heart sounds and intact distal pulses.   Pulmonary/Chest: Effort normal and breath sounds normal.  Musculoskeletal: She exhibits tenderness. She exhibits no edema.  Complete ROM of left upper and lower extremity with pronounced tenderness.   Neurological: She is alert and oriented to person, place, and time.  Skin: Skin is warm and dry.  Psychiatric: She has a normal mood and affect. Her behavior is normal. Judgment and thought content normal.   Dg Lumbar Spine Complete  Result Date: 09/25/2016 CLINICAL DATA:  Fall on ice.  Low back pain.  Initial encounter. EXAM: LUMBAR SPINE - COMPLETE 4+ VIEW COMPARISON:  None. FINDINGS: There is no evidence of lumbar spine fracture. Alignment is normal. Intervertebral disc spaces are maintained. No evidence of facet arthropathy or other focal bone lesions. IMPRESSION: Negative. Electronically Signed   By: Myles Rosenthal M.D.   On: 09/25/2016 17:13   Dg Forearm Left  Result Date: 09/25/2016 CLINICAL DATA:  Fall, pain. EXAM: LEFT FOREARM - 2 VIEW COMPARISON:  None. FINDINGS: There is no evidence of fracture or other  focal bone lesions. Soft tissues are unremarkable. IMPRESSION: Negative. Electronically Signed   By: Charlett NoseKevin  Dover M.D.   On: 09/25/2016 17:05   Dg Humerus Left  Result Date: 09/25/2016 CLINICAL DATA:  Patient fell on pavement after slipping on ice. EXAM: LEFT HUMERUS - 2+ VIEW COMPARISON:  None. FINDINGS: There is no evidence of fracture or other focal bone lesions. Soft tissues are unremarkable. IMPRESSION: Negative. Electronically Signed   By: Kennith CenterEric  Mansell M.D.   On: 09/25/2016 17:06   Dg Hip Unilat W Or W/o Pelvis 2-3 Views Left  Result Date: 09/25/2016 CLINICAL DATA:  Slip and fall with left hip pain, initial encounter EXAM: DG HIP (WITH OR WITHOUT PELVIS) 2-3V LEFT COMPARISON:   None. FINDINGS: Pelvic ring is intact. No acute fracture or dislocation is noted. No soft tissue changes are seen. IMPRESSION: No acute abnormality noted. Electronically Signed   By: Alcide CleverMark  Lukens M.D.   On: 09/25/2016 17:14      Assessment & Plan:  1. Pain of left upper extremity 2. Back pain, unspecified back location, unspecified back pain laterality, unspecified chronicity 3. Pain of left lower extremity  Plan: Take Naproxen 500 mg twice daily with food x 14 days to relieve inflammation. Return in two weeks if pain has not improved or worsens.   Godfrey PickKimberly S. Tiburcio PeaHarris, MSN, FNP-C [facility] [phone]

## 2016-09-25 NOTE — Patient Instructions (Addendum)
Take Naproxen 500 mg twice daily with food x 14 days to relieve inflammation.  Return in two weeks if pain has not improved or worsens.   IF you received an x-ray today, you will receive an invoice from [facility]. Please contact [facility] at [phone] with questions or concerns regarding your invoice.   IF you received labwork today, you will receive an invoice from [facility]. Please contact [facility] at [phone] with questions or concerns regarding your invoice.   Our billing staff will not be able to assist you with questions regarding bills from these companies.  You will be contacted with the lab results as soon as they are available. The fastest way to get your results is to activate your My Chart account. Instructions are located on the last page of this paperwork. If you have not heard from us regarding the results in 2 weeks, please contact this office.      Triceps Tendinitis Rehab Ask your health care provider which exercises are safe for you. Do exercises exactly as told by your health care provider and adjust them as directed. It is normal to feel mild stretching, pulling, tightness, or discomfort as you do these exercises, but you should stop right away if you feel sudden pain or your pain gets worse. Do not begin these exercises until told by your health care provider. Stretching and range of motion exercises These exercises warm up your muscles and joints and improve the movement and flexibility of your arm. These exercises can also help to relieve pain, numbness, and tingling. Exercise A: Elbow flexion, passive 1. Stand or sit with your left / right arm at your side. 2. Gently push your left / right arm toward your shoulder using your other hand. Bend your elbow as far as your health care provider tells you to. You may be instructed to try to bend your elbow more and more each week. 3. Hold for __________ seconds. 4. Slowly return to the starting  position. Repeat __________ times. Complete this exercise __________ times a day. Exercise B: Elbow extension, passive, supine 1. Lie on your back on a firm surface. 2. Place a folded towel under your left / right upper arm, so your elbow and shoulder are at the same height and your elbow does not touch the surface that you are lying on. 3. Move your left / right arm out to your side, keeping your elbow bent. 4. Slowly straighten your elbow until you feel a stretch on the inside of your elbow. Keep your arm and chest muscles relaxed. 5. Hold for __________ seconds. 6. Slowly return to the starting position. Repeat __________ times. Complete this exercise __________ times a day. Strengthening exercises These exercises build strength and endurance in your arm. Endurance is the ability to use your muscles for a long time, even after they get tired. Exercise C: Elbow flexion, supinated 1. Sit on a stable chair without armrests, or stand. 2. Hold a __________ weight in your left / right hand, or hold an exercise band with both hands. You palms should face up toward the ceiling at the starting position. 3. Bend your left / right elbow and move your hand up toward your shoulder. Keep your other arm straight down, in the starting position. 4. Slowly return to the starting position. Repeat __________ times. Complete this exercise __________ times a day. Exercise D: Elbow extension 1. Lie on your back. 2. If instructed, hold a __________ weight in your left / right hand.  3. Bend your left / right elbow to an "L" shape (90 degrees) so your elbow is pointed up toward the ceiling and the weight is over your head. 4. Straighten your left / right elbow, raising your hand toward the ceiling. Use your other hand to support your left / right upper arm and to keep it still. 5. Slowly return to the starting position. Repeat __________ times. Complete this exercise __________ times a day. Exercise E: Scapular  retraction 1. Sit in a stable chair without arms, or stand. 2. Secure an exercise band to a stable object in front of you so the band is at shoulder height. 3. Hold one end of the exercise band in each hand. 4. Squeeze your shoulder blades together and move your elbows slightly behind you. Do not shrug your shoulders. 5. Hold for __________ seconds. 6. Slowly return to the starting position. Repeat __________ times. Complete this exercise __________ times a day. This information is not intended to replace advice given to you by your health care provider. Make sure you discuss any questions you have with your health care provider. Document Released: 08/20/2005 Document Revised: 04/26/2016 Document Reviewed: 07/29/2015 Elsevier Interactive Patient Education  2017 [employer].    Hip Pain Your hip is the joint between your upper legs and your lower pelvis. The bones, cartilage, tendons, and muscles of your hip joint perform a lot of work each day supporting your body weight and allowing you to move around. Hip pain can range from a minor ache to severe pain in one or both of your hips. Pain may be felt on the inside of the hip joint near the groin, or the outside near the buttocks and upper thigh. You may have swelling or stiffness as well. Follow these instructions at home:  Take medicines only as directed by your health care provider.  Apply ice to the injured area:  Put ice in a plastic bag.  Place a towel between your skin and the bag.  Leave the ice on for 15-20 minutes at a time, 3-4 times a day.  Keep your leg raised (elevated) when possible to lessen swelling.  Avoid activities that cause pain.  Follow specific exercises as directed by your health care provider.  Sleep with a pillow between your legs on your most comfortable side.  Record how often you have hip pain, the location of the pain, and what it feels like. Contact a health care provider if:  You are unable to  put weight on your leg.  Your hip is red or swollen or very tender to touch.  Your pain or swelling continues or worsens after 1 week.  You have increasing difficulty walking.  You have a fever. Get help right away if:  You have fallen.  You have a sudden increase in pain and swelling in your hip. This information is not intended to replace advice given to you by your health care provider. Make sure you discuss any questions you have with your health care provider. Document Released: 02/07/2010 Document Revised: 01/26/2016 Document Reviewed: 04/16/2013 Elsevier Interactive Patient Education  2017 [employer] Inc.  Back Exercises Introduction If you have pain in your back, do these exercises 2-3 times each day or as told by your doctor. When the pain goes away, do the exercises once each day, but repeat the steps more times for each exercise (do more repetitions). If you do not have pain in your back, do these exercises once each day or as told  by your doctor. Exercises Single Knee to Chest  Do these steps 3-5 times in a row for each leg: 5. Lie on your back on a firm bed or the floor with your legs stretched out. 6. Bring one knee to your chest. 7. Hold your knee to your chest by grabbing your knee or thigh. 8. Pull on your knee until you feel a gentle stretch in your lower back. 9. Keep doing the stretch for 10-30 seconds. 10. Slowly let go of your leg and straighten it. Pelvic Tilt  Do these steps 5-10 times in a row: 7. Lie on your back on a firm bed or the floor with your legs stretched out. 8. Bend your knees so they point up to the ceiling. Your feet should be flat on the floor. 9. Tighten your lower belly (abdomen) muscles to press your lower back against the floor. This will make your tailbone point up to the ceiling instead of pointing down to your feet or the floor. 10. Stay in this position for 5-10 seconds while you gently tighten your muscles and breathe evenly. Cat-Cow   Do these steps until your lower back bends more easily: 5. Get on your hands and knees on a firm surface. Keep your hands under your shoulders, and keep your knees under your hips. You may put padding under your knees. 6. Let your head hang down, and make your tailbone point down to the floor so your lower back is round like the back of a cat. 7. Stay in this position for 5 seconds. 8. Slowly lift your head and make your tailbone point up to the ceiling so your back hangs low (sags) like the back of a cow. 9. Stay in this position for 5 seconds. Press-Ups  Do these steps 5-10 times in a row: 6. Lie on your belly (face-down) on the floor. 7. Place your hands near your head, about shoulder-width apart. 8. While you keep your back relaxed and keep your hips on the floor, slowly straighten your arms to raise the top half of your body and lift your shoulders. Do not use your back muscles. To make yourself more comfortable, you may change where you place your hands. 9. Stay in this position for 5 seconds. 10. Slowly return to lying flat on the floor. Bridges  Do these steps 10 times in a row: 7. Lie on your back on a firm surface. 8. Bend your knees so they point up to the ceiling. Your feet should be flat on the floor. 9. Tighten your butt muscles and lift your butt off of the floor until your waist is almost as high as your knees. If you do not feel the muscles working in your butt and the back of your thighs, slide your feet 1-2 inches farther away from your butt. 10. Stay in this position for 3-5 seconds. 11. Slowly lower your butt to the floor, and let your butt muscles relax. If this exercise is too easy, try doing it with your arms crossed over your chest. Belly Crunches  Do these steps 5-10 times in a row: 1. Lie on your back on a firm bed or the floor with your legs stretched out. 2. Bend your knees so they point up to the ceiling. Your feet should be flat on the floor. 3. Cross your  arms over your chest. 4. Tip your chin a little bit toward your chest but do not bend your neck. 5. Tighten your belly muscles and  slowly raise your chest just enough to lift your shoulder blades a tiny bit off of the floor. 6. Slowly lower your chest and your head to the floor. Back Lifts  Do these steps 5-10 times in a row: 1. Lie on your belly (face-down) with your arms at your sides, and rest your forehead on the floor. 2. Tighten the muscles in your legs and your butt. 3. Slowly lift your chest off of the floor while you keep your hips on the floor. Keep the back of your head in line with the curve in your back. Look at the floor while you do this. 4. Stay in this position for 3-5 seconds. 5. Slowly lower your chest and your face to the floor. Contact a doctor if:  Your back pain gets a lot worse when you do an exercise.  Your back pain does not lessen 2 hours after you exercise. If you have any of these problems, stop doing the exercises. Do not do them again unless your doctor says it is okay. Get help right away if:  You have sudden, very bad back pain. If this happens, stop doing the exercises. Do not do them again unless your doctor says it is okay. This information is not intended to replace advice given to you by your health care provider. Make sure you discuss any questions you have with your health care provider. Document Released: 09/22/2010 Document Revised: 01/26/2016 Document Reviewed: 10/14/2014  2017 Elsevier

## 2016-10-19 ENCOUNTER — Ambulatory Visit (INDEPENDENT_AMBULATORY_CARE_PROVIDER_SITE_OTHER): Payer: BLUE CROSS/BLUE SHIELD | Admitting: Family Medicine

## 2016-10-19 VITALS — BP 112/64 | HR 86 | Temp 98.4°F | Ht 59.0 in | Wt 118.4 lb

## 2016-10-19 DIAGNOSIS — Z5321 Procedure and treatment not carried out due to patient leaving prior to being seen by health care provider: Secondary | ICD-10-CM

## 2016-10-19 DIAGNOSIS — J069 Acute upper respiratory infection, unspecified: Secondary | ICD-10-CM | POA: Diagnosis not present

## 2016-10-19 NOTE — Patient Instructions (Signed)
     IF you received an x-ray today, you will receive an invoice from [facility]. Please contact  Radiology at [phone] with questions or concerns regarding your invoice.   IF you received labwork today, you will receive an invoice from [facility]. Please contact [facility] at [phone] with questions or concerns regarding your invoice.   Our billing staff will not be able to assist you with questions regarding bills from these companies.  You will be contacted with the lab results as soon as they are available. The fastest way to get your results is to activate your My Chart account. Instructions are located on the last page of this paperwork. If you have not heard from us regarding the results in 2 weeks, please contact this office.     

## 2016-10-19 NOTE — Progress Notes (Signed)
Left without being seen.

## 2016-10-26 ENCOUNTER — Telehealth: Payer: Self-pay
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
## 2016-10-29 DIAGNOSIS — Z113 Encounter for screening for infections with a predominantly sexual mode of transmission: Secondary | ICD-10-CM | POA: Diagnosis not present

## 2016-10-29 DIAGNOSIS — Z3202 Encounter for pregnancy test, result negative: Secondary | ICD-10-CM | POA: Diagnosis not present

## 2016-10-29 DIAGNOSIS — N926 Irregular menstruation, unspecified: Secondary | ICD-10-CM | POA: Diagnosis not present

## 2016-10-29 DIAGNOSIS — Z118 Encounter for screening for other infectious and parasitic diseases: Secondary | ICD-10-CM | POA: Diagnosis not present

## 2016-11-01 DIAGNOSIS — L700 Acne vulgaris: Secondary | ICD-10-CM | POA: Diagnosis not present

## 2016-11-19 DIAGNOSIS — Z113 Encounter for screening for infections with a predominantly sexual mode of transmission: Secondary | ICD-10-CM | POA: Diagnosis not present

## 2016-11-21 DIAGNOSIS — N390 Urinary tract infection, site not specified: Secondary | ICD-10-CM | POA: Diagnosis not present

## 2016-11-28 DIAGNOSIS — Z202 Contact with and (suspected) exposure to infections with a predominantly sexual mode of transmission: Secondary | ICD-10-CM | POA: Diagnosis not present

## 2016-11-28 DIAGNOSIS — N390 Urinary tract infection, site not specified: Secondary | ICD-10-CM | POA: Diagnosis not present

## 2016-12-03 ENCOUNTER — Ambulatory Visit (INDEPENDENT_AMBULATORY_CARE_PROVIDER_SITE_OTHER): Payer: BLUE CROSS/BLUE SHIELD | Admitting: Obstetrics & Gynecology

## 2016-12-03 ENCOUNTER — Encounter: Payer: Self-pay | Admitting: Obstetrics & Gynecology

## 2016-12-03 VITALS — Ht 60.0 in | Wt 118.1 lb

## 2016-12-03 DIAGNOSIS — N898 Other specified noninflammatory disorders of vagina: Secondary | ICD-10-CM | POA: Diagnosis not present

## 2016-12-03 DIAGNOSIS — L989 Disorder of the skin and subcutaneous tissue, unspecified: Secondary | ICD-10-CM

## 2016-12-03 DIAGNOSIS — Z113 Encounter for screening for infections with a predominantly sexual mode of transmission: Secondary | ICD-10-CM | POA: Diagnosis not present

## 2016-12-03 NOTE — Patient Instructions (Signed)
Return to clinic for any scheduled appointments or for any gynecologic concerns as needed.   

## 2016-12-03 NOTE — Progress Notes (Signed)
   GYNECOLOGY OFFICE VISIT NOTE  History:  21 y.o. G0P0000 here today for evaluation of bump on left inner thigh.  Was evaluated at outside center and had a blood test positive for HSV. She is concerned she has active HSV; never hadan outbreak. Boyfriend does not have HSV, as far as she knows.  Bump is mildly tender, but also reports mild vulvar irritation. History of oral fever blisters when she was younger. She denies any abnormal vaginal discharge, bleeding, pelvic pain or other concerns.   Past Medical History:  Diagnosis Date  . Eczema   . Migraine   . Urinary frequency     History reviewed. No pertinent surgical history.  The following portions of the patient's history were reviewed and updated as appropriate: allergies, current medications, past family history, past medical history, past social history, past surgical history and problem list.    Review of Systems:  Pertinent items noted in HPI and remainder of comprehensive ROS otherwise negative.   Objective:  Physical Exam Ht 5' (1.524 m)   Wt 118 lb 1.6 oz (53.6 kg)   BMI 23.06 kg/m  CONSTITUTIONAL: Well-developed, well-nourished female in no acute distress.  HENT:  Normocephalic, atraumatic. External right and left ear normal. Oropharynx is clear and moist EYES: Conjunctivae and EOM are normal. Pupils are equal, round, and reactive to light. No scleral icterus.  NECK: Normal range of motion, supple, no masses SKIN: Skin is warm and dry. No rash noted. Not diaphoretic. No erythema. No pallor. NEUROLOGIC: Alert and oriented to person, place, and time. Normal reflexes, muscle tone coordination. No cranial nerve deficit noted. PSYCHIATRIC: Normal mood and affect. Normal behavior. Normal judgment and thought content. CARDIOVASCULAR: Normal heart rate noted RESPIRATORY: Effort and breath sounds normal, no problems with respiration noted ABDOMEN: Soft, no distention noted.   PELVIC: Normal appearing external genitalia; normal  appearing distal vaginal mucosa. Scant white discharge noted, samples obtained.  Diffuse mild tenderness on palpation of vulva.  Small papular lesion noted on left inner thigh, resembling folliculitis lesion. No erythema, no ulcerations, no drainage noted anywhere on the vulvar, perineum or inner thighs.   MUSCULOSKELETAL: Normal range of motion. No edema noted.  Labs and Imaging No results found.  Assessment & Plan:  1. Benign skin lesion of thigh Patient reassured about lesion, likely folliculitis.    2. Vaginal irritation No lesions seen. Very low suspicion for genital HSV, will follow up culture. Other vaginal discharge pathogens also tested, will follow up results and manage accordingly. Patient was reassured that blood test likely showed past exposure to HSV (cold sores in the past) and this is not concerning.  - Herpes simplex virus culture - Cervicovaginal ancillary only Proper vulvar hygiene emphasized: discussed avoidance of perfumed soaps, detergents, lotions and any type of douches; in addition to wearing cotton underwear and no underwear at night.  Also recommended cleaning front to back, voiding and cleaning up after intercourse.   Routine preventative health maintenance measures emphasized; she declined HPV vaccine series today.  Return if symptoms worsen or fail to improve.   Total face-to-face time with patient: 15 minutes. Over 50% of encounter was spent on counseling and coordination of care.   Jaynie Collins, MD, FACOG Attending Obstetrician & Gynecologist, [facility] for [employer], [facility]

## 2016-12-03 NOTE — Progress Notes (Deleted)
Pt presents for ROB c/o Carpal tunnel is getting worst in R hand. She is using brace with minimal relief.

## 2016-12-04 LAB — CERVICOVAGINAL ANCILLARY ONLY
Bacterial vaginitis: NEGATIVE
Candida vaginitis: NEGATIVE
Chlamydia: NEGATIVE
NEISSERIA GONORRHEA: NEGATIVE
Trichomonas: NEGATIVE

## 2016-12-05 ENCOUNTER — Encounter: Payer: Self-pay | Admitting: Obstetrics & Gynecology

## 2016-12-05 ENCOUNTER — Telehealth: Payer: Self-pay | Admitting: *Deleted

## 2016-12-05 NOTE — Telephone Encounter (Signed)
Pt called to office for lab results.  Return call to pt. Pt made aware of results from vag d/c, all test negative. Pt made aware that HSV culture is still pending. Pt advised that she may call back in a few days to check for results or once resulted she should get call if abnormal.

## 2016-12-06 LAB — HERPES SIMPLEX VIRUS CULTURE

## 2016-12-13 ENCOUNTER — Other Ambulatory Visit: Payer: Self-pay | Admitting: *Deleted

## 2016-12-13 DIAGNOSIS — Z7689 Persons encountering health services in other specified circumstances: Secondary | ICD-10-CM

## 2016-12-13 NOTE — Progress Notes (Signed)
Referral requested for primary care.

## 2017-01-31 DIAGNOSIS — R6884 Jaw pain: Secondary | ICD-10-CM | POA: Diagnosis not present

## 2017-02-25 DIAGNOSIS — H0289 Other specified disorders of eyelid: Secondary | ICD-10-CM | POA: Diagnosis not present

## 2017-03-01 DIAGNOSIS — B309 Viral conjunctivitis, unspecified: Secondary | ICD-10-CM | POA: Diagnosis not present

## 2017-03-01 DIAGNOSIS — H04212 Epiphora due to excess lacrimation, left lacrimal gland: Secondary | ICD-10-CM | POA: Diagnosis not present

## 2017-03-07 ENCOUNTER — Encounter: Payer: Self-pay | Admitting: Family Medicine

## 2017-03-07 ENCOUNTER — Ambulatory Visit (INDEPENDENT_AMBULATORY_CARE_PROVIDER_SITE_OTHER): Payer: BLUE CROSS/BLUE SHIELD | Admitting: Family Medicine

## 2017-03-07 VITALS — BP 98/62 | HR 94 | Temp 98.2°F | Ht 60.0 in | Wt 116.6 lb

## 2017-03-07 DIAGNOSIS — G44209 Tension-type headache, unspecified, not intractable: Secondary | ICD-10-CM

## 2017-03-07 MED ORDER — RIZATRIPTAN BENZOATE 5 MG PO TABS: 5.0000 mg | ORAL_TABLET | ORAL | 0 refills | Status: AC | PRN

## 2017-03-07 MED ORDER — CYCLOBENZAPRINE HCL 10 MG PO TABS: 10.0000 mg | ORAL_TABLET | Freq: Three times a day (TID) | ORAL | 0 refills | Status: DC | PRN

## 2017-03-07 NOTE — Progress Notes (Signed)
   CC: headaches  HPI HA: never seen by neuro or HA clinic. Noticed them at age [age]. Previously treated by pediatrician and then prior PCP. Now has not been getting headaches as frequently. Starting one now. Never had any head imaging. Thought tension HA in the past. Jaw clenches up and precipitates these headaches. Variable location on head. Now uses Maxalt and aleeve. Maxalt helps - makes her sleepy. Sometimes sensitive to light. Minimal phonophobia. No nausea or vomiting. No auras. Today is 7/10. Other days this week 9/10 every day.   Referred by: self   Medical history: migraines (seen in the past by peds, [place]. [place]olph in [place]CP).  Surgical history: never  Social history:  Lives with: alone Occupation: works at [employer]  Tobacco use: never Alcohol use: occasionally - last drink 3 months ago  Drug use: none   CC, SH/smoking status, and VS noted  Objective: BP 98/62   Pulse 94   Temp 98.2 F (36.8 C) (Oral)   Ht 5' (1.524 m)   Wt 116 lb 9.6 oz (52.9 kg)   LMP 02/27/2017   SpO2 98%   BMI 22.77 kg/m  Gen: NAD, alert, cooperative, and pleasant. HEENT: NCAT, EOMI, PERRL CV: RRR, no murmur Resp: CTAB, no wheezes, non-labored Abd: SNTND, BS present, no guarding or organomegaly Ext: No edema, warm Neuro: Alert and oriented, Speech clear, No gross deficits  Assessment and plan:  Acute non intractable tension-type headache Patient calls these migraines, however also cause some tension headaches, and does not describe any migraine symptoms. Previously treated with Imitrex, and now with Maxalt. Refilled her Maxalt for her, and gave a short course of Flexeril, as patient is tender over paraspinal cervical muscles and trapezius muscles. Suspect that these headaches are stemming from muscle spasms. Follow up in one month to further assess headaches. Instructed the patient to keep a headache diary.   No orders of the defined types were placed in this encounter.   Meds  ordered this encounter  Medications  . cyclobenzaprine (FLEXERIL) 10 MG tablet    Sig: Take 1 tablet (10 mg total) by mouth 3 (three) times daily as needed for muscle spasms.    Dispense:  15 tablet    Refill:  0  . rizatriptan (MAXALT) 5 MG tablet    Sig: Take 1 tablet (5 mg total) by mouth as needed for migraine. May repeat in 2 hours if needed    Dispense:  10 tablet    Refill:  0    Health Maintenance: needs PAP at next visit   Loni MuseKate Felicite Zeimet, MD, PGY1 03/08/2017 10:52 AM

## 2017-03-07 NOTE — Patient Instructions (Signed)
It was a pleasure to see you today! Thank you for choosing [facility] for your primary care. Katherine Rodgers was seen for migraines, new patient.   Our plans for today were:  Try the flexeril for headaches.   Continue to use the maxalt as needed.    You should return to our clinic to see Dr. Chanetta Marshallimberlake in 1 month for migraines.   Best,  Dr. Chanetta Marshallimberlake

## 2017-03-08 DIAGNOSIS — G44209 Tension-type headache, unspecified, not intractable: Secondary | ICD-10-CM | POA: Insufficient documentation

## 2017-03-08 NOTE — Assessment & Plan Note (Signed)
Patient calls these migraines, however also cause some tension headaches, and does not describe any migraine symptoms. Previously treated with Imitrex, and now with Maxalt. Refilled her Maxalt for her, and gave a short course of Flexeril, as patient is tender over paraspinal cervical muscles and trapezius muscles. Suspect that these headaches are stemming from muscle spasms. Follow up in one month to further assess headaches. Instructed the patient to keep a headache diary.

## 2017-03-13 DIAGNOSIS — H01022 Squamous blepharitis right lower eyelid: Secondary | ICD-10-CM | POA: Diagnosis not present

## 2017-03-13 DIAGNOSIS — H01025 Squamous blepharitis left lower eyelid: Secondary | ICD-10-CM | POA: Diagnosis not present

## 2017-03-13 DIAGNOSIS — H01024 Squamous blepharitis left upper eyelid: Secondary | ICD-10-CM | POA: Diagnosis not present

## 2017-03-13 DIAGNOSIS — H01021 Squamous blepharitis right upper eyelid: Secondary | ICD-10-CM | POA: Diagnosis not present

## 2017-03-15 DIAGNOSIS — H04209 Unspecified epiphora, unspecified lacrimal gland: Secondary | ICD-10-CM | POA: Diagnosis not present

## 2017-04-04 DIAGNOSIS — Z3009 Encounter for other general counseling and advice on contraception: Secondary | ICD-10-CM | POA: Diagnosis not present

## 2017-04-04 DIAGNOSIS — Z3202 Encounter for pregnancy test, result negative: Secondary | ICD-10-CM | POA: Diagnosis not present

## 2017-04-04 DIAGNOSIS — Z124 Encounter for screening for malignant neoplasm of cervix: Secondary | ICD-10-CM | POA: Diagnosis not present

## 2017-04-04 DIAGNOSIS — Z01411 Encounter for gynecological examination (general) (routine) with abnormal findings: Secondary | ICD-10-CM | POA: Diagnosis not present

## 2017-04-04 DIAGNOSIS — Z0000 Encounter for general adult medical examination without abnormal findings: Secondary | ICD-10-CM | POA: Diagnosis not present

## 2017-04-04 DIAGNOSIS — Z113 Encounter for screening for infections with a predominantly sexual mode of transmission: Secondary | ICD-10-CM | POA: Diagnosis not present

## 2017-04-12 ENCOUNTER — Ambulatory Visit: Payer: BLUE CROSS/BLUE SHIELD | Admitting: Family Medicine

## 2017-05-22 DIAGNOSIS — R21 Rash and other nonspecific skin eruption: Secondary | ICD-10-CM | POA: Diagnosis not present

## 2017-05-22 DIAGNOSIS — H1013 Acute atopic conjunctivitis, bilateral: Secondary | ICD-10-CM | POA: Diagnosis not present

## 2017-06-07 ENCOUNTER — Encounter: Payer: Self-pay | Admitting: Obstetrics & Gynecology

## 2017-06-11 DIAGNOSIS — L309 Dermatitis, unspecified: Secondary | ICD-10-CM | POA: Diagnosis not present

## 2017-07-10 DIAGNOSIS — L700 Acne vulgaris: Secondary | ICD-10-CM | POA: Diagnosis not present

## 2017-07-10 DIAGNOSIS — L309 Dermatitis, unspecified: Secondary | ICD-10-CM | POA: Diagnosis not present

## 2017-07-16 ENCOUNTER — Encounter: Payer: Self-pay | Admitting: Obstetrics

## 2017-07-16 ENCOUNTER — Ambulatory Visit: Payer: BLUE CROSS/BLUE SHIELD | Admitting: Obstetrics

## 2017-07-16 VITALS — BP 101/66 | HR 71 | Ht 60.0 in | Wt 118.2 lb

## 2017-07-16 DIAGNOSIS — R140 Abdominal distension (gaseous): Secondary | ICD-10-CM

## 2017-07-16 DIAGNOSIS — Z3200 Encounter for pregnancy test, result unknown: Secondary | ICD-10-CM

## 2017-07-16 DIAGNOSIS — R300 Dysuria: Secondary | ICD-10-CM

## 2017-07-16 DIAGNOSIS — N898 Other specified noninflammatory disorders of vagina: Secondary | ICD-10-CM

## 2017-07-16 DIAGNOSIS — Z3202 Encounter for pregnancy test, result negative: Secondary | ICD-10-CM | POA: Diagnosis not present

## 2017-07-16 DIAGNOSIS — R102 Pelvic and perineal pain: Secondary | ICD-10-CM | POA: Diagnosis not present

## 2017-07-16 DIAGNOSIS — N939 Abnormal uterine and vaginal bleeding, unspecified: Secondary | ICD-10-CM | POA: Diagnosis not present

## 2017-07-16 DIAGNOSIS — Z113 Encounter for screening for infections with a predominantly sexual mode of transmission: Secondary | ICD-10-CM | POA: Diagnosis not present

## 2017-07-16 LAB — POCT URINALYSIS DIPSTICK
Bilirubin, UA: NEGATIVE
Blood, UA: NEGATIVE
Glucose, UA: NEGATIVE
Ketones, UA: NEGATIVE
Leukocytes, UA: NEGATIVE
Nitrite, UA: NEGATIVE
Protein, UA: NEGATIVE
Spec Grav, UA: 1.01
Urobilinogen, UA: 0.2 U/dL
pH, UA: 7

## 2017-07-16 LAB — POCT URINE PREGNANCY: Preg Test, Ur: NEGATIVE

## 2017-07-16 NOTE — Progress Notes (Signed)
Patient ID: Katherine Rodgers, female   DOB: 04/27/1996, [age] y.o.   MRN: [id number]  Chief Complaint  Patient presents with  . Gynecologic Exam    HPI Katherine Rodgers is a 21 y.o. female.  Vaginal bleeding and cramping after intercourse.  Bloating and abdominal pain after meals.  Also has burning with urination. HPI  Past Medical History:  Diagnosis Date  . Eczema   . Migraine   . Urinary frequency     History reviewed. No pertinent surgical history.  Family History  Problem Relation Age of Onset  . Hypertension Mother     Social History Social History   Tobacco Use  . Smoking status: Never Smoker  . Smokeless tobacco: Never Used  Substance Use Topics  . Alcohol use: No    Alcohol/week: 0.0 oz  . Drug use: No    Allergies  Allergen Reactions  . Augmentin [Amoxicillin-Pot Clavulanate] Nausea And Vomiting    Current Outpatient Medications  Medication Sig Dispense Refill  . Norethindrone-Ethinyl Estradiol-Fe (GENERESS FE) 0.8-25 MG-MCG tablet Chew 1 tablet by mouth daily. 1 Package 11  . rizatriptan (MAXALT) 5 MG tablet Take 1 tablet (5 mg total) by mouth as needed for migraine. May repeat in 2 hours if needed 10 tablet 0   No current facility-administered medications for this visit.     Review of Systems Review of Systems Constitutional: negative for fatigue and weight loss Respiratory: negative for cough and wheezing Cardiovascular: negative for chest pain, fatigue and palpitations Gastrointestinal: positive for abdominal pain and bloating after meals Genitourinary:positive for vaginal bleeding and cramping after intercourse.  Burning with urination. Integument/breast: negative for nipple discharge Musculoskeletal:negative for myalgias Neurological: negative for gait problems and tremors Behavioral/Psych: negative for abusive relationship, depression Endocrine: negative for temperature intolerance      Blood pressure 101/66, pulse 71, height 5' (1.524 m), weight  118 lb 3.2 oz (53.6 kg), last menstrual period 06/19/2017.  Physical Exam Physical Exam General:   alert  Skin:   no rash or abnormalities  Lungs:   clear to auscultation bilaterally  Heart:   regular rate and rhythm, S1, S2 normal, no murmur, click, rub or gallop  Breasts:   normal without suspicious masses, skin or nipple changes or axillary nodes  Abdomen:  normal findings: no organomegaly, soft, non-tender and no hernia  Pelvis:  External genitalia: normal general appearance Urinary system: urethral meatus normal and bladder without fullness, nontender Vaginal: normal without tenderness, induration or masses Cervix: ring of erythema around os Adnexa: normal bimanual exam Uterus: anteverted and non-tender, normal size    50% of 15 min visit spent on counseling and coordination of care.    Data Reviewed Urinalysis  Assessment     1. Vaginal discharge Rx: - Cervicovaginal ancillary only  2. Pelvic pain in female Rx: - US PELVIC COMPLETE WITH TRANSVAGINAL; Future - Ambulatory referral to Gastroenterology  3. Abnormal uterine bleeding (AUB) Rx: - US PELVIC COMPLETE WITH TRANSVAGINAL; Future  4. Abdominal bloating Rx: - US PELVIC COMPLETE WITH TRANSVAGINAL; Future - Ambulatory referral to Gastroenterology  5. Burning with urination Rx: - POCT Urinalysis Dipstick - Urine Culture  6. Possible pregnancy Rx: - POCT urine pregnancy    Plan    Follow up in 2 weeks  Orders Placed This Encounter  Procedures  . Urine Culture  . US PELVIC COMPLETE WITH TRANSVAGINAL    Epic order Wt. 118/ no needs Ins- bcbs Pda/ptLaurey Arrow- aneethra [phone]- [phone]    Standing Status:   Future  Standing Expiration Date:   09/15/2018    Order Specific Question:   Reason for Exam (SYMPTOM  OR DIAGNOSIS REQUIRED)    Answer:   Pelvic pain and bloating.  AUB after intercourse.    Order Specific Question:   Preferred imaging location?    [facility]  . Ambulatory referral to  Gastroenterology    Referral Priority:   Routine    Referral Type:   Consultation    Referral Reason:   Specialty Services Required    Requested Specialty:   Gastroenterology    Number of Visits Requested:   1  . POCT urine pregnancy  . POCT Urinalysis Dipstick   No orders of the defined types were placed in this encounter.   Brock BadHARLES A. Annamary Buschman MD

## 2017-07-16 NOTE — Progress Notes (Signed)
Pt complains of having vaginal bleeding after IC last wed. Bleeding lasted for about a day heavy, and also complains of having abdominal pains. Bleeding has stopped, abdominal pain still present. She also complains of burning with urination.

## 2017-07-17 ENCOUNTER — Encounter: Payer: Self-pay | Admitting: Obstetrics

## 2017-07-17 LAB — CERVICOVAGINAL ANCILLARY ONLY
Bacterial vaginitis: NEGATIVE
CANDIDA VAGINITIS: NEGATIVE
Chlamydia: NEGATIVE
NEISSERIA GONORRHEA: NEGATIVE
TRICH (WINDOWPATH): NEGATIVE

## 2017-07-18 LAB — URINE CULTURE

## 2017-07-29 ENCOUNTER — Ambulatory Visit
Admission: RE | Admit: 2017-07-29 | Discharge: 2017-07-29 | Disposition: A | Discharge status: Home / Self Care | Payer: BLUE CROSS/BLUE SHIELD | Source: Ambulatory Visit | Attending: Obstetrics | Admitting: Obstetrics

## 2017-07-29 ENCOUNTER — Other Ambulatory Visit: Payer: Self-pay | Admitting: Obstetrics

## 2017-07-29 DIAGNOSIS — R102 Pelvic and perineal pain: Secondary | ICD-10-CM

## 2017-07-29 DIAGNOSIS — R140 Abdominal distension (gaseous): Secondary | ICD-10-CM

## 2017-07-29 DIAGNOSIS — N939 Abnormal uterine and vaginal bleeding, unspecified: Secondary | ICD-10-CM

## 2017-08-05 ENCOUNTER — Ambulatory Visit: Payer: BLUE CROSS/BLUE SHIELD | Admitting: Obstetrics

## 2017-09-09 DIAGNOSIS — J029 Acute pharyngitis, unspecified: Secondary | ICD-10-CM | POA: Diagnosis not present

## 2017-09-09 DIAGNOSIS — J069 Acute upper respiratory infection, unspecified: Secondary | ICD-10-CM | POA: Diagnosis not present

## 2017-09-10 DIAGNOSIS — R509 Fever, unspecified: Secondary | ICD-10-CM | POA: Diagnosis not present

## 2017-09-10 DIAGNOSIS — J039 Acute tonsillitis, unspecified: Secondary | ICD-10-CM | POA: Diagnosis not present

## 2017-09-17 ENCOUNTER — Encounter: Payer: Self-pay | Admitting: Obstetrics

## 2017-10-03 ENCOUNTER — Ambulatory Visit: Payer: BLUE CROSS/BLUE SHIELD | Admitting: Obstetrics

## 2017-10-03 ENCOUNTER — Encounter: Payer: Self-pay | Admitting: Obstetrics

## 2017-10-03 VITALS — BP 107/75 | HR 77 | Wt 117.5 lb

## 2017-10-03 DIAGNOSIS — Z113 Encounter for screening for infections with a predominantly sexual mode of transmission: Secondary | ICD-10-CM | POA: Diagnosis not present

## 2017-10-03 DIAGNOSIS — N898 Other specified noninflammatory disorders of vagina: Secondary | ICD-10-CM

## 2017-10-03 DIAGNOSIS — B369 Superficial mycosis, unspecified: Secondary | ICD-10-CM

## 2017-10-03 LAB — POCT URINALYSIS DIPSTICK
BILIRUBIN UA: NEGATIVE
Glucose, UA: NEGATIVE
Ketones, UA: NEGATIVE
Nitrite, UA: NEGATIVE
Protein, UA: NEGATIVE
RBC UA: NEGATIVE
Spec Grav, UA: 1.01 (ref 1.010–1.025)
UROBILINOGEN UA: 0.2 U/dL
pH, UA: 7 (ref 5.0–8.0)

## 2017-10-03 MED ORDER — TERCONAZOLE 0.8 % VA CREA: 1.0000 | TOPICAL_CREAM | Freq: Every day | VAGINAL | 0 refills | Status: DC

## 2017-10-03 MED ORDER — CLOTRIMAZOLE 1 % EX CREA: 1.0000 "application " | TOPICAL_CREAM | Freq: Two times a day (BID) | CUTANEOUS | 0 refills | Status: DC

## 2017-10-03 NOTE — Progress Notes (Signed)
Presents for vaginal issues. Patient is very vague with her description.  It feels like something is rubbing against vagina and it is very irritating. Denies discharge, odor, burning, redness.

## 2017-10-03 NOTE — Progress Notes (Signed)
Patient ID: Katherine MunchLasha Rodgers, female   DOB: 09/10/1995, 22 y.o.   MRN: [id number]  Chief Complaint  Patient presents with  . Gynecologic Exam    vaginal irritation    HPI Katherine Rodgers is a 22 y.o. female.  Feels like some rubbing against vagina that is very irritating.  Denies vaginal discharge, odor or itching. HPI  Past Medical History:  Diagnosis Date  . Eczema   . Migraine   . Urinary frequency     History reviewed. No pertinent surgical history.  Family History  Problem Relation Age of Onset  . Hypertension Mother     Social History Social History   Tobacco Use  . Smoking status: Never Smoker  . Smokeless tobacco: Never Used  Substance Use Topics  . Alcohol use: No    Alcohol/week: 0.0 oz  . Drug use: No    Allergies  Allergen Reactions  . Augmentin [Amoxicillin-Pot Clavulanate] Nausea And Vomiting    Current Outpatient Medications  Medication Sig Dispense Refill  . Norethindrone-Ethinyl Estradiol-Fe (GENERESS FE) 0.8-25 MG-MCG tablet Chew 1 tablet by mouth daily. 1 Package 11  . rizatriptan (MAXALT) 5 MG tablet Take 1 tablet (5 mg total) by mouth as needed for migraine. May repeat in 2 hours if needed 10 tablet 0   No current facility-administered medications for this visit.     Review of Systems Review of Systems Constitutional: negative for fatigue and weight loss Respiratory: negative for cough and wheezing Cardiovascular: negative for chest pain, fatigue and palpitations Gastrointestinal: negative for abdominal pain and change in bowel habits Genitourinary:positive for vaginal irritation Integument/breast: negative for nipple discharge Musculoskeletal:negative for myalgias Neurological: negative for gait problems and tremors Behavioral/Psych: negative for abusive relationship, depression Endocrine: negative for temperature intolerance      Blood pressure 107/75, pulse 77, weight 117 lb 8 oz (53.3 kg).  Physical Exam Physical Exam   General:  Alert and no distress Abdomen:  normal findings: no organomegaly, soft, non-tender and no hernia  Pelvis:  External genitalia: normal general appearance Urinary system: urethral meatus normal and bladder without fullness, nontender Vaginal: normal without tenderness, induration or masses Cervix: normal appearance Adnexa: normal bimanual exam Uterus: anteverted and non-tender, normal size    50% of 15 min visit spent on counseling and coordination of care.   Data Reviewed Labs  Assessment     1. Vaginal irritation Rx: - terconazole (TERAZOL 3) 0.8 % vaginal cream; Place 1 applicator vaginally at bedtime.  Dispense: 20 g; Refill: 0  2. Superficial fungus infection of skin Rx: - clotrimazole (LOTRIMIN) 1 % cream; Apply 1 application topically 2 (two) times daily.  Dispense: 30 g; Refill: 0    Plan    Follow up prn  No orders of the defined types were placed in this encounter.  No orders of the defined types were placed in this encounter.    Brock BadHARLES A. Jahnae Mcadoo MD

## 2017-10-03 NOTE — Addendum Note (Signed)
Addended by: Tim LairLARK, LATOYA on: 10/03/2017 03:05 PM   Modules accepted: Orders

## 2017-10-04 LAB — CERVICOVAGINAL ANCILLARY ONLY
Bacterial vaginitis: NEGATIVE
CANDIDA VAGINITIS: POSITIVE — AB
Chlamydia: NEGATIVE
Neisseria Gonorrhea: NEGATIVE
Trichomonas: NEGATIVE

## 2017-10-05 ENCOUNTER — Encounter: Payer: Self-pay | Admitting: Obstetrics

## 2017-10-05 LAB — URINE CULTURE: Organism ID, Bacteria: NO GROWTH

## 2017-10-07 ENCOUNTER — Telehealth: Payer: Self-pay

## 2017-10-07 ENCOUNTER — Other Ambulatory Visit: Payer: Self-pay | Admitting: Obstetrics

## 2017-10-07 NOTE — Telephone Encounter (Signed)
Return call to patient regarding results.  I consulted w/Dr.Harper. Pt made aware of + yeast and urine Cx negative and to continue Rx as directed that Dr.Harper sent. Pt notes itching still pt asked to waiti an additional few more days to give Rx time to work.  Pt advised to contact the office Thursday if no relief.  Pt voiced understanding.  [facility] CMA

## 2017-10-25 ENCOUNTER — Telehealth: Payer: Self-pay

## 2017-10-25 NOTE — Telephone Encounter (Signed)
Patient complained that the Terazol did not work, she needs Rx for Diflucan. [facility]

## 2017-10-27 DIAGNOSIS — J340 Abscess, furuncle and carbuncle of nose: Secondary | ICD-10-CM | POA: Diagnosis not present

## 2017-10-27 DIAGNOSIS — R300 Dysuria: Secondary | ICD-10-CM | POA: Diagnosis not present

## 2017-10-27 DIAGNOSIS — N3000 Acute cystitis without hematuria: Secondary | ICD-10-CM | POA: Diagnosis not present

## 2017-11-03 ENCOUNTER — Other Ambulatory Visit: Payer: Self-pay | Admitting: Obstetrics

## 2017-11-03 DIAGNOSIS — B373 Candidiasis of vulva and vagina: Secondary | ICD-10-CM

## 2017-11-03 DIAGNOSIS — B3731 Acute candidiasis of vulva and vagina: Secondary | ICD-10-CM

## 2017-11-03 MED ORDER — FLUCONAZOLE 150 MG PO TABS: 150.0000 mg | ORAL_TABLET | Freq: Once | ORAL | 0 refills | Status: AC

## 2017-11-04 DIAGNOSIS — L700 Acne vulgaris: Secondary | ICD-10-CM | POA: Diagnosis not present

## 2017-11-04 DIAGNOSIS — L509 Urticaria, unspecified: Secondary | ICD-10-CM | POA: Diagnosis not present

## 2017-11-07 NOTE — Progress Notes (Signed)
Notified via My Chart

## 2017-12-24 ENCOUNTER — Encounter: Payer: Self-pay | Admitting: Obstetrics

## 2017-12-30 ENCOUNTER — Ambulatory Visit: Payer: BLUE CROSS/BLUE SHIELD | Admitting: Obstetrics

## 2017-12-30 ENCOUNTER — Encounter: Payer: Self-pay | Admitting: Obstetrics

## 2017-12-30 VITALS — BP 110/73 | HR 89 | Temp 98.2°F | Resp 16 | Wt 122.5 lb

## 2017-12-30 DIAGNOSIS — N939 Abnormal uterine and vaginal bleeding, unspecified: Secondary | ICD-10-CM

## 2017-12-30 DIAGNOSIS — Z3202 Encounter for pregnancy test, result negative: Secondary | ICD-10-CM | POA: Diagnosis not present

## 2017-12-30 DIAGNOSIS — Z3009 Encounter for other general counseling and advice on contraception: Secondary | ICD-10-CM | POA: Diagnosis not present

## 2017-12-30 DIAGNOSIS — Z113 Encounter for screening for infections with a predominantly sexual mode of transmission: Secondary | ICD-10-CM | POA: Diagnosis not present

## 2017-12-30 LAB — POCT URINE PREGNANCY: PREG TEST UR: NEGATIVE

## 2017-12-30 NOTE — Progress Notes (Signed)
Subjective:    Katherine Rodgers is a 22 y.o. female who presents for contraception counseling. The patient has been having BTB with the generic OCP's.  She has tried 2 generics for [employer], and has had BTB with both.  She does well with the [facility] brand name OCP. The patient is sexually active. Pertinent past medical history: none.  The information documented in the HPI was reviewed and verified.  Menstrual History: OB History    Gravida  0   Para  0   Term  0   Preterm  0   AB  0   Living  0     SAB  0   TAB  0   Ectopic  0   Multiple  0   Live Births  0              Patient Active Problem List   Diagnosis Date Noted  . Acute non intractable tension-type headache 03/08/2017   Past Medical History:  Diagnosis Date  . Eczema   . Migraine   . Urinary frequency     History reviewed. No pertinent surgical history.   Current Outpatient Medications:  .  Norethindrone-Ethinyl Estradiol-Fe (GENERESS FE) 0.8-25 MG-MCG tablet, Chew 1 tablet by mouth daily., Disp: 1 Package, Rfl: 11 .  rizatriptan (MAXALT) 5 MG tablet, Take 1 tablet (5 mg total) by mouth as needed for migraine. May repeat in 2 hours if needed, Disp: 10 tablet, Rfl: 0 .  terconazole (TERAZOL 3) 0.8 % vaginal cream, Place 1 applicator vaginally at bedtime., Disp: 20 g, Rfl: 0 .  clotrimazole (LOTRIMIN) 1 % cream, Apply 1 application topically 2 (two) times daily. (Patient not taking: Reported on 12/30/2017), Disp: 30 g, Rfl: 0 Allergies  Allergen Reactions  . Augmentin [Amoxicillin-Pot Clavulanate] Nausea And Vomiting    Social History   Tobacco Use  . Smoking status: Never Smoker  . Smokeless tobacco: Never Used  Substance Use Topics  . Alcohol use: No    Alcohol/week: 0.0 oz    Family History  Problem Relation Age of Onset  . Hypertension Mother        Review of Systems Constitutional: negative for weight loss Genitourinary:negative for abnormal menstrual periods and vaginal  discharge   Objective:   BP 110/73 (BP Location: Right Arm, Patient Position: Sitting, Cuff Size: Large)   Pulse 89   Temp 98.2 F (36.8 C) (Oral)   Resp 16   Wt 122 lb 8 oz (55.6 kg)   LMP 12/26/2017 (Exact Date)   BMI 23.92 kg/m    PE:  Deferred  Lab Review Urine pregnancy test Labs reviewed yes Radiologic studies reviewed yes  50% of 20 min visit spent on counseling and coordination of care.    Assessment:    22 y.o., discontinuing OCP (estrogen/progesterone), no contraindications.    1. Abnormal uterine bleeding (AUB) Rx: - POCT urine pregnancy - Urine cytology ancillary only  2. Encounter for other general counseling or advice on contraception - wants a OCP vacation, then possibly start Gene[facility] Fe brand name OCP   Plan:    All questions answered. Chlamydia specimen. Contraception: none. Follow up in 3 months. GC specimen. Pregnancy test, result: negative.    No orders of the defined types were placed in this encounter.  Orders Placed This Encounter  Procedures  . POCT urine pregnancy    Brock Bad MD

## 2017-12-31 LAB — URINE CYTOLOGY ANCILLARY ONLY
Chlamydia: NEGATIVE
Neisseria Gonorrhea: NEGATIVE

## 2018-01-09 DIAGNOSIS — H1013 Acute atopic conjunctivitis, bilateral: Secondary | ICD-10-CM | POA: Diagnosis not present

## 2018-01-28 DIAGNOSIS — H1013 Acute atopic conjunctivitis, bilateral: Secondary | ICD-10-CM | POA: Diagnosis not present

## 2018-02-11 DIAGNOSIS — H00024 Hordeolum internum left upper eyelid: Secondary | ICD-10-CM | POA: Diagnosis not present

## 2018-03-12 ENCOUNTER — Ambulatory Visit: Payer: BLUE CROSS/BLUE SHIELD | Admitting: Obstetrics

## 2018-03-13 ENCOUNTER — Encounter: Payer: Self-pay | Admitting: Obstetrics

## 2018-03-13 ENCOUNTER — Ambulatory Visit: Payer: BLUE CROSS/BLUE SHIELD | Admitting: Obstetrics

## 2018-03-13 VITALS — BP 103/73 | HR 91 | Ht 60.0 in | Wt 122.4 lb

## 2018-03-13 DIAGNOSIS — R109 Unspecified abdominal pain: Secondary | ICD-10-CM

## 2018-03-13 DIAGNOSIS — R300 Dysuria: Secondary | ICD-10-CM

## 2018-03-13 DIAGNOSIS — Z3202 Encounter for pregnancy test, result negative: Secondary | ICD-10-CM

## 2018-03-13 DIAGNOSIS — Z113 Encounter for screening for infections with a predominantly sexual mode of transmission: Secondary | ICD-10-CM

## 2018-03-13 DIAGNOSIS — N926 Irregular menstruation, unspecified: Secondary | ICD-10-CM

## 2018-03-13 LAB — POCT URINALYSIS DIPSTICK
Bilirubin, UA: NEGATIVE
Blood, UA: NEGATIVE
GLUCOSE UA: NEGATIVE
KETONES UA: NEGATIVE
Leukocytes, UA: NEGATIVE
NITRITE UA: NEGATIVE
PROTEIN UA: NEGATIVE
SPEC GRAV UA: 1.01 (ref 1.010–1.025)
Urobilinogen, UA: 0.2 E.U./dL
pH, UA: 6 (ref 5.0–8.0)

## 2018-03-13 LAB — POCT URINE PREGNANCY: Preg Test, Ur: NEGATIVE

## 2018-03-13 NOTE — Progress Notes (Signed)
Patient ID: Katherine Rodgers, female   DOB: 02-Feb-1996, 22 y.o.   MRN: [id number]  Chief Complaint  Patient presents with  . STD screenig    HPI Katherine Rodgers is a 22 y.o. female.  Complains of abdominal cramping like period is coming on.  She took Plan B in May and has not had a period since then.  She takes OCP's for contraception.  Has had negative UPT's at home. HPI  Past Medical History:  Diagnosis Date  . Eczema   . Migraine   . Urinary frequency     History reviewed. No pertinent surgical history.  Family History  Problem Relation Age of Onset  . Hypertension Mother     Social History Social History   Tobacco Use  . Smoking status: Never Smoker  . Smokeless tobacco: Never Used  Substance Use Topics  . Alcohol use: No    Alcohol/week: 0.0 oz  . Drug use: No    Allergies  Allergen Reactions  . Augmentin [Amoxicillin-Pot Clavulanate] Nausea And Vomiting    Current Outpatient Medications  Medication Sig Dispense Refill  . Norethindrone-Ethinyl Estradiol-Fe (GENERESS FE) 0.8-25 MG-MCG tablet Chew 1 tablet by mouth daily. 1 Package 11  . rizatriptan (MAXALT) 5 MG tablet Take 1 tablet (5 mg total) by mouth as needed for migraine. May repeat in 2 hours if needed 10 tablet 0  . clotrimazole (LOTRIMIN) 1 % cream Apply 1 application topically 2 (two) times daily. (Patient not taking: Reported on 12/30/2017) 30 g 0  . terconazole (TERAZOL 3) 0.8 % vaginal cream Place 1 applicator vaginally at bedtime. (Patient not taking: Reported on 03/13/2018) 20 g 0   No current facility-administered medications for this visit.     Review of Systems Review of Systems Constitutional: negative for fatigue and weight loss Respiratory: negative for cough and wheezing Cardiovascular: negative for chest pain, fatigue and palpitations Gastrointestinal: POSITIVE for abdominal pain but no change in bowel habits Genitourinary:negative Integument/breast: negative for nipple  discharge Musculoskeletal:negative for myalgias Neurological: negative for gait problems and tremors Behavioral/Psych: negative for abusive relationship, depression Endocrine: negative for temperature intolerance      Blood pressure 103/73, pulse 91, height 5' (1.524 m), weight 122 lb 6.4 oz (55.5 kg), last menstrual period 01/28/2018.  Physical Exam Physical Exam           General:  Alert and no distress Abdomen:  normal findings: no organomegaly, soft, non-tender and no hernia  Pelvis:  External genitalia: normal general appearance Urinary system: urethral meatus normal and bladder without fullness, nontender Vaginal: normal without tenderness, induration or masses Cervix: normal appearance Adnexa: normal bimanual exam Uterus: anteverted and non-tender, normal size    50% of 15 min visit spent on counseling and coordination of care.   Data Reviewed UPT:  Negative  Assessment and Plan:    1. Abdominal cramping Rx: - POCT Urinalysis Dipstick - POCT urine pregnancy  2. Dysuria Rx: - Urine Culture  3. Screen for STD (sexually transmitted disease) Rx: - Hepatitis B surface antigen - Hepatitis C antibody - Cervicovaginal ancillary only - HIV antibody - RPR  4. Missed periods.  Probable hormonal imbalance.  Negative UPT. - continue OCP's    Plan    Follow up prn    Orders Placed This Encounter  Procedures  . Urine Culture  . Hepatitis B surface antigen  . Hepatitis C antibody  . HIV antibody  . RPR  . POCT Urinalysis Dipstick  . POCT urine pregnancy   No  orders of the defined types were placed in this encounter.   Brock BadHARLES A. Odette Watanabe MD 03-13-2018

## 2018-03-13 NOTE — Progress Notes (Signed)
Pt requests for STD testing with blood work. Took planned B in May; no period since then. Had 3 neg UPT's at home so she requests quant. Takes OCP's daily; recent ABx use. Pt has cramping prior to voiding. Pap due next month per pt, will complete at PCP office back home.

## 2018-03-14 LAB — HEPATITIS C ANTIBODY: Hep C Virus Ab: <0.1 s/co ratio (ref 0.0–0.9)

## 2018-03-14 LAB — CERVICOVAGINAL ANCILLARY ONLY
CHLAMYDIA, DNA PROBE: NEGATIVE
Neisseria Gonorrhea: NEGATIVE
Trichomonas: NEGATIVE

## 2018-03-14 LAB — HEPATITIS B SURFACE ANTIGEN: Hepatitis B Surface Ag: NEGATIVE

## 2018-03-14 LAB — HIV ANTIBODY (ROUTINE TESTING W REFLEX): HIV SCREEN 4TH GENERATION: NONREACTIVE

## 2018-03-14 LAB — RPR: RPR Ser Ql: NONREACTIVE

## 2018-03-15 LAB — URINE CULTURE

## 2018-03-17 ENCOUNTER — Telehealth: Payer: Self-pay

## 2018-03-17 NOTE — Telephone Encounter (Signed)
Pt called wanting to know her lab results, I advised her all of her lab work was normal. Pt would like to talk to a provider because she still is concerned that her period has not come since June, we did UPT in office on 03/13/18 that was negative. I advised pt I would send note to provider, pt verbalized understanding.

## 2018-04-18 ENCOUNTER — Ambulatory Visit (INDEPENDENT_AMBULATORY_CARE_PROVIDER_SITE_OTHER): Payer: BLUE CROSS/BLUE SHIELD | Admitting: Obstetrics

## 2018-04-18 ENCOUNTER — Encounter: Payer: Self-pay | Admitting: Obstetrics

## 2018-04-18 VITALS — BP 102/71 | HR 78 | Wt 124.7 lb

## 2018-04-18 DIAGNOSIS — Z01419 Encounter for gynecological examination (general) (routine) without abnormal findings: Secondary | ICD-10-CM

## 2018-04-18 DIAGNOSIS — Z124 Encounter for screening for malignant neoplasm of cervix: Secondary | ICD-10-CM

## 2018-04-18 DIAGNOSIS — Z3041 Encounter for surveillance of contraceptive pills: Secondary | ICD-10-CM | POA: Diagnosis not present

## 2018-04-18 MED ORDER — NORETHIN-ETH ESTRADIOL-FE 0.8-25 MG-MCG PO CHEW
1.0000 | CHEWABLE_TABLET | Freq: Every day | ORAL | 4 refills | Status: DC
Start: 2018-04-18 — End: 2019-04-29

## 2018-04-18 NOTE — Progress Notes (Signed)
Pt presents for annual and pap.

## 2018-04-18 NOTE — Progress Notes (Signed)
Subjective:        Cory MunchLasha Decola is a [age] y.o. female here for a routine exam.  Current complaints: None.    Personal health questionnaire:  Is patient Ashkenazi Jewish, have a family history of breast and/or ovarian cancer: no Is there a family history of uterine cancer diagnosed at age < 2250, gastrointestinal cancer, urinary tract cancer, family member who is a Personnel officerLynch syndrome-associated carrier: no Is the patient overweight and hypertensive, family history of diabetes, personal history of gestational diabetes, preeclampsia or PCOS: no Is patient over 7222, have PCOS,  family history of premature CHD under age 22, diabetes, smoke, have hypertension or peripheral artery disease:  no At any time, has a partner hit, kicked or otherwise hurt or frightened you?: no Over the past 2 weeks, have you felt down, depressed or hopeless?: no Over the past 2 weeks, have you felt little interest or pleasure in doing things?:no   Gynecologic History Patient's last menstrual period was 03/18/2018. Contraception: OCP (estrogen/progesterone) Last Pap: n/a. Results were: n/a Last mammogram: n/a. Results were: n/a  Obstetric History OB History  Gravida Para Term Preterm AB Living  0 0 0 0 0 0  SAB TAB Ectopic Multiple Live Births  0 0 0 0 0    Past Medical History:  Diagnosis Date  . Eczema   . Migraine   . Urinary frequency     History reviewed. No pertinent surgical history.   Current Outpatient Medications:  .  Norethindrone-Ethinyl Estradiol-Fe (GENERESS FE) 0.8-25 MG-MCG tablet, Chew 1 tablet by mouth daily., Disp: 3 Package, Rfl: 4 .  rizatriptan (MAXALT) 5 MG tablet, Take 1 tablet (5 mg total) by mouth as needed for migraine. May repeat in 2 hours if needed, Disp: 10 tablet, Rfl: 0 .  clotrimazole (LOTRIMIN) 1 % cream, Apply 1 application topically 2 (two) times daily. (Patient not taking: Reported on 12/30/2017), Disp: 30 g, Rfl: 0 .  terconazole (TERAZOL 3) 0.8 % vaginal cream, Place 1  applicator vaginally at bedtime. (Patient not taking: Reported on 03/13/2018), Disp: 20 g, Rfl: 0 Allergies  Allergen Reactions  . Augmentin [Amoxicillin-Pot Clavulanate] Nausea And Vomiting    Social History   Tobacco Use  . Smoking status: Never Smoker  . Smokeless tobacco: Never Used  Substance Use Topics  . Alcohol use: No    Alcohol/week: 0.0 standard drinks    Family History  Problem Relation Age of Onset  . Hypertension Mother       Review of Systems  Constitutional: negative for fatigue and weight loss Respiratory: negative for cough and wheezing Cardiovascular: negative for chest pain, fatigue and palpitations Gastrointestinal: negative for abdominal pain and change in bowel habits Musculoskeletal:negative for myalgias Neurological: negative for gait problems and tremors Behavioral/Psych: negative for abusive relationship, depression Endocrine: negative for temperature intolerance    Genitourinary:negative for abnormal menstrual periods, genital lesions, hot flashes, sexual problems and vaginal discharge Integument/breast: negative for breast lump, breast tenderness, nipple discharge and skin lesion(s)    Objective:       BP 102/71   Pulse 78   Wt 124 lb 11.2 oz (56.6 kg)   LMP 03/18/2018   BMI 24.35 kg/m  General:   alert  Skin:   no rash or abnormalities  Lungs:   clear to auscultation bilaterally  Heart:   regular rate and rhythm, S1, S2 normal, no murmur, click, rub or gallop  Breasts:   normal without suspicious masses, skin or nipple changes or axillary nodes  Abdomen:  normal findings: no organomegaly, soft, non-tender and no hernia  Pelvis:  External genitalia: normal general appearance Urinary system: urethral meatus normal and bladder without fullness, nontender Vaginal: normal without tenderness, induration or masses Cervix: normal appearance Adnexa: normal bimanual exam Uterus: anteverted and non-tender, normal size   Lab Review Urine  pregnancy test Labs reviewed yes Radiologic studies reviewed no  50% of 20 min visit spent on counseling and coordination of care.    Assessment:     1. Encounter for routine gynecological examination with Papanicolaou smear of cervix Rx: - Cytology - PAP  2. Encounter for surveillance of contraceptive pills Rx: - Norethindrone-Ethinyl Estradiol-Fe (GENERESS FE) 0.8-25 MG-MCG tablet; Chew 1 tablet by mouth daily.  Dispense: 3 Package; Refill: 4    Plan:    Education reviewed: calcium supplements, depression evaluation, low fat, low cholesterol diet, safe sex/STD prevention, self breast exams and weight bearing exercise. Contraception: OCP (estrogen/progesterone). Follow up in: 1 year.   Meds ordered this encounter  Medications  . Norethindrone-Ethinyl Estradiol-Fe (GENERESS FE) 0.8-25 MG-MCG tablet    Sig: Chew 1 tablet by mouth daily.    Dispense:  3 Package    Refill:  4   No orders of the defined types were placed in this encounter.   Brock BadHARLES A. Darreon Lutes MD 22-16-2016

## 2018-04-22 LAB — CYTOLOGY - PAP: Diagnosis: NEGATIVE

## 2018-06-23 DIAGNOSIS — H02889 Meibomian gland dysfunction of unspecified eye, unspecified eyelid: Secondary | ICD-10-CM | POA: Diagnosis not present

## 2018-06-23 DIAGNOSIS — H04203 Unspecified epiphora, bilateral lacrimal glands: Secondary | ICD-10-CM | POA: Diagnosis not present

## 2018-06-24 DIAGNOSIS — R252 Cramp and spasm: Secondary | ICD-10-CM | POA: Diagnosis not present

## 2018-08-07 ENCOUNTER — Ambulatory Visit: Payer: BLUE CROSS/BLUE SHIELD | Admitting: Obstetrics

## 2018-08-07 ENCOUNTER — Encounter: Payer: Self-pay | Admitting: Obstetrics

## 2018-08-07 VITALS — BP 124/84 | HR 90 | Wt 124.0 lb

## 2018-08-07 DIAGNOSIS — R300 Dysuria: Secondary | ICD-10-CM | POA: Diagnosis not present

## 2018-08-07 DIAGNOSIS — R109 Unspecified abdominal pain: Secondary | ICD-10-CM | POA: Diagnosis not present

## 2018-08-07 DIAGNOSIS — Z113 Encounter for screening for infections with a predominantly sexual mode of transmission: Secondary | ICD-10-CM

## 2018-08-07 DIAGNOSIS — N946 Dysmenorrhea, unspecified: Secondary | ICD-10-CM | POA: Diagnosis not present

## 2018-08-07 DIAGNOSIS — N898 Other specified noninflammatory disorders of vagina: Secondary | ICD-10-CM | POA: Diagnosis not present

## 2018-08-07 LAB — POCT URINALYSIS DIPSTICK
Bilirubin, UA: NEGATIVE
GLUCOSE UA: NEGATIVE
Ketones, UA: NEGATIVE
LEUKOCYTES UA: NEGATIVE
Nitrite, UA: NEGATIVE
Protein, UA: NEGATIVE
RBC UA: NEGATIVE
Spec Grav, UA: 1.015 (ref 1.010–1.025)
UROBILINOGEN UA: 0.2 U/dL
pH, UA: 6.5 (ref 5.0–8.0)

## 2018-08-07 MED ORDER — IBUPROFEN 800 MG PO TABS: 800.0000 mg | ORAL_TABLET | Freq: Three times a day (TID) | ORAL | 5 refills | Status: DC | PRN

## 2018-08-07 NOTE — Progress Notes (Signed)
RGYN pt presents for problem visit today .  CC: Abdominal pain just before cycles per pt will sometimes be 1-3 wks before period is due.  Last BM was 2 days ago. Pt also c/o: vaginal odor possible BV  LMP:07/15/18 Contraception:Pills

## 2018-08-07 NOTE — Progress Notes (Signed)
Patient ID: Katherine Rodgers, female   DOB: 1996-07-05, 22 y.o.   MRN: [id number]  Chief Complaint  Patient presents with  . Abdominal Pain    HPI Katherine Rodgers is a 22 y.o. female.  Severe cramping ~2 weeks prior to period.  Urinary frequency with dribbling of small amount of urine and incomplete emptying of bladder. HPI  Past Medical History:  Diagnosis Date  . Eczema   . Migraine   . Urinary frequency     History reviewed. No pertinent surgical history.  Family History  Problem Relation Age of Onset  . Hypertension Mother     Social History Social History   Tobacco Use  . Smoking status: Never Smoker  . Smokeless tobacco: Never Used  Substance Use Topics  . Alcohol use: No    Alcohol/week: 0.0 standard drinks  . Drug use: No    Allergies  Allergen Reactions  . Augmentin [Amoxicillin-Pot Clavulanate] Nausea And Vomiting    Current Outpatient Medications  Medication Sig Dispense Refill  . Norethindrone-Ethinyl Estradiol-Fe (GENERESS FE) 0.8-25 MG-MCG tablet Chew 1 tablet by mouth daily. 3 Package 4  . rizatriptan (MAXALT) 5 MG tablet Take 1 tablet (5 mg total) by mouth as needed for migraine. May repeat in 2 hours if needed 10 tablet 0  . clotrimazole (LOTRIMIN) 1 % cream Apply 1 application topically 2 (two) times daily. (Patient not taking: Reported on 12/30/2017) 30 g 0  . ibuprofen (ADVIL,MOTRIN) 800 MG tablet Take 1 tablet (800 mg total) by mouth every 8 (eight) hours as needed. 30 tablet 5  . terconazole (TERAZOL 3) 0.8 % vaginal cream Place 1 applicator vaginally at bedtime. (Patient not taking: Reported on 03/13/2018) 20 g 0   No current facility-administered medications for this visit.     Review of Systems Review of Systems Constitutional: negative for fatigue and weight loss Respiratory: negative for cough and wheezing Cardiovascular: negative for chest pain, fatigue and palpitations Gastrointestinal: negative for abdominal pain and change in bowel  habits Genitourinary:negative Integument/breast: negative for nipple discharge Musculoskeletal:negative for myalgias Neurological: negative for gait problems and tremors Behavioral/Psych: negative for abusive relationship, depression Endocrine: negative for temperature intolerance      Blood pressure 124/84, pulse 90, weight 124 lb (56.2 kg), last menstrual period 07/15/2018.  Physical Exam Physical Exam General:   alert  Skin:   no rash or abnormalities  Lungs:   clear to auscultation bilaterally  Heart:   regular rate and rhythm, S1, S2 normal, no murmur, click, rub or gallop  Breasts:   normal without suspicious masses, skin or nipple changes or axillary nodes  Abdomen:  normal findings: no organomegaly, soft, non-tender and no hernia  Pelvis:  External genitalia: normal general appearance Urinary system: urethral meatus normal and bladder without fullness, nontender Vaginal: normal without tenderness, induration or masses Cervix: normal appearance Adnexa: normal bimanual exam Uterus: anteverted and non-tender, normal size    50% of 15 min visit spent on counseling and coordination of care.   Data Reviewed Wet Prep Cultures  Assessment     1. Dysmenorrhea Rx: - ibuprofen (ADVIL,MOTRIN) 800 MG tablet; Take 1 tablet (800 mg total) by mouth every 8 (eight) hours as needed.  Dispense: 30 tablet; Refill: 5  2. Vaginal discharge Rx: - Cervicovaginal ancillary only( [place])  3. Dysuria Rx: - POCT Urinalysis Dipstick - Urine Culture    Plan    Follow up in 3 months  Orders Placed This Encounter  Procedures  . Urine Culture  .  POCT Urinalysis Dipstick   Meds ordered this encounter  Medications  . ibuprofen (ADVIL,MOTRIN) 800 MG tablet    Sig: Take 1 tablet (800 mg total) by mouth every 8 (eight) hours as needed.    Dispense:  30 tablet    Refill:  5      Brock BadHARLES A. Micha Dosanjh MD 08-07-2018

## 2018-08-08 LAB — CERVICOVAGINAL ANCILLARY ONLY
Bacterial vaginitis: NEGATIVE
CANDIDA VAGINITIS: NEGATIVE
CHLAMYDIA, DNA PROBE: NEGATIVE
NEISSERIA GONORRHEA: NEGATIVE
Trichomonas: NEGATIVE

## 2018-08-09 LAB — URINE CULTURE

## 2018-08-11 ENCOUNTER — Telehealth: Payer: Self-pay

## 2018-08-11 NOTE — Telephone Encounter (Signed)
Returned call and advised of results, pt had additional questions, notified provider to contact the pt.

## 2018-08-18 IMAGING — DX DG FOREARM 2V*L*
2 series · 2 of 2 positions shown · non-contrast
Comparison: None.

CLINICAL DATA: Fall, pain.

EXAM:
LEFT FOREARM - 2 VIEW

[forearm ap]
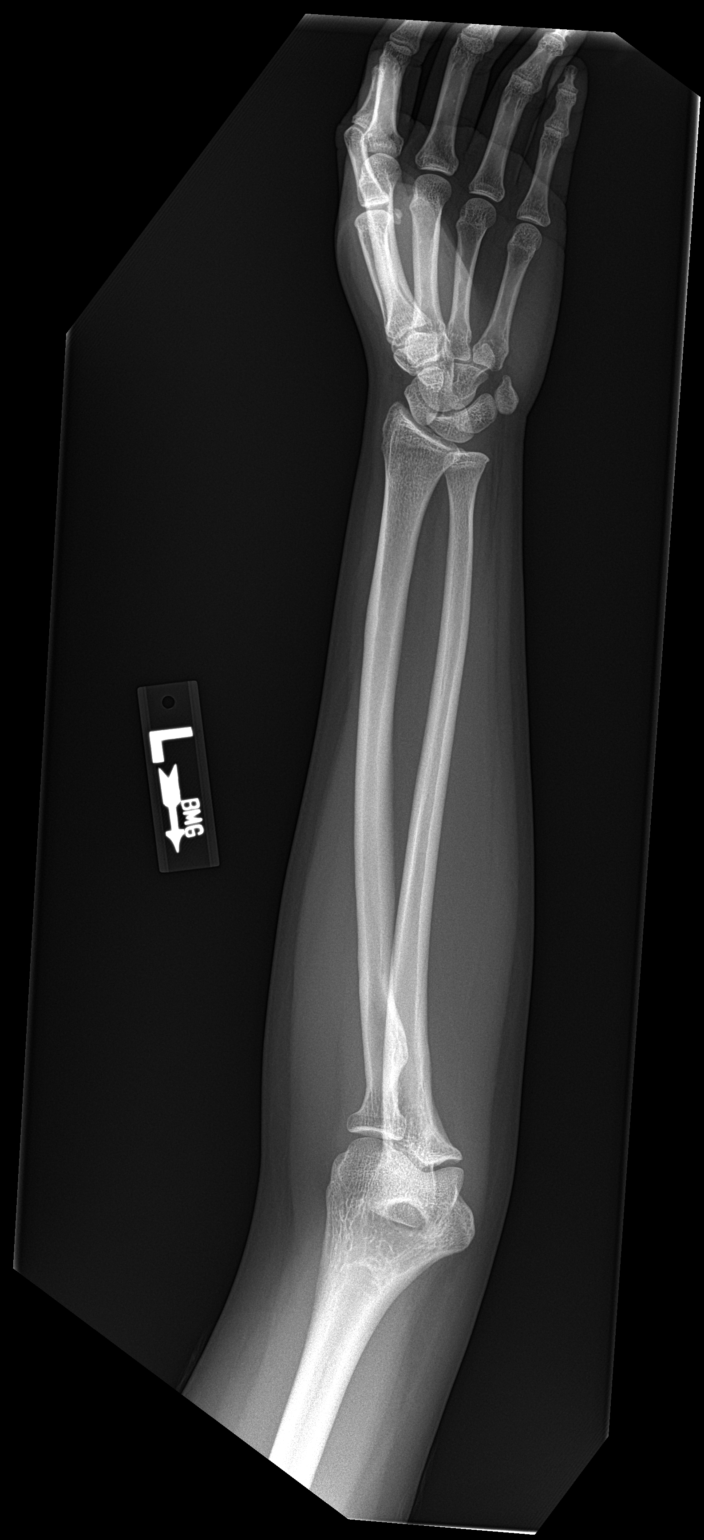

[forearm lat]
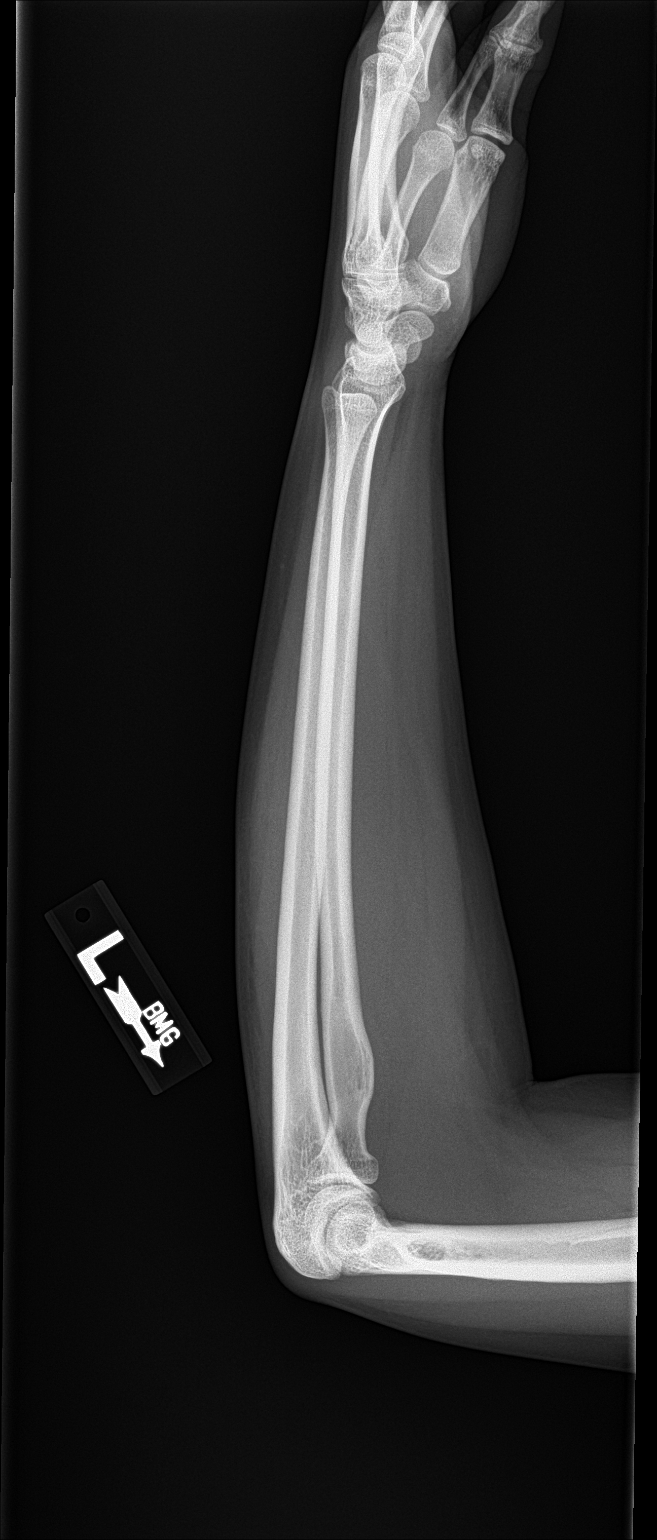

[2 of 2 positions shown; findings below may reference images not displayed]

FINDINGS: There is no evidence of fracture or other focal bone lesions. Soft
tissues are unremarkable.
IMPRESSION: Negative.

## 2018-10-20 DIAGNOSIS — J029 Acute pharyngitis, unspecified: Secondary | ICD-10-CM | POA: Diagnosis not present

## 2018-11-07 ENCOUNTER — Encounter: Payer: Self-pay | Admitting: Obstetrics

## 2018-11-07 ENCOUNTER — Ambulatory Visit: Payer: BLUE CROSS/BLUE SHIELD | Admitting: Obstetrics

## 2018-11-07 VITALS — BP 120/84 | HR 83 | Wt 124.6 lb

## 2018-11-07 DIAGNOSIS — R300 Dysuria: Secondary | ICD-10-CM

## 2018-11-07 DIAGNOSIS — R102 Pelvic and perineal pain: Secondary | ICD-10-CM

## 2018-11-07 DIAGNOSIS — N898 Other specified noninflammatory disorders of vagina: Secondary | ICD-10-CM

## 2018-11-07 DIAGNOSIS — N946 Dysmenorrhea, unspecified: Secondary | ICD-10-CM | POA: Diagnosis not present

## 2018-11-07 DIAGNOSIS — Z113 Encounter for screening for infections with a predominantly sexual mode of transmission: Secondary | ICD-10-CM

## 2018-11-07 LAB — POCT URINALYSIS DIPSTICK
Bilirubin, UA: NEGATIVE
Glucose, UA: NEGATIVE
Ketones, UA: NEGATIVE
LEUKOCYTES UA: NEGATIVE
Nitrite, UA: NEGATIVE
Protein, UA: NEGATIVE
RBC UA: NEGATIVE
Spec Grav, UA: 1.01 (ref 1.010–1.025)
Urobilinogen, UA: 0.2 E.U./dL
pH, UA: 7 (ref 5.0–8.0)

## 2018-11-07 NOTE — Progress Notes (Signed)
Patient ID: Katherine Rodgers, female   DOB: 1996-03-22, 23 y.o.   MRN: [id number]  Chief Complaint  Patient presents with  . Follow-up    HPI Katherine Rodgers is a 23 y.o. female.  Pelvic pain before period. HPI  Past Medical History:  Diagnosis Date  . Eczema   . Migraine   . Urinary frequency     History reviewed. No pertinent surgical history.  Family History  Problem Relation Age of Onset  . Hypertension Mother     Social History Social History   Tobacco Use  . Smoking status: Never Smoker  . Smokeless tobacco: Never Used  Substance Use Topics  . Alcohol use: No    Alcohol/week: 0.0 standard drinks  . Drug use: No    Allergies  Allergen Reactions  . Augmentin [Amoxicillin-Pot Clavulanate] Nausea And Vomiting    Current Outpatient Medications  Medication Sig Dispense Refill  . cyclobenzaprine (FLEXERIL) 10 MG tablet cyclobenzaprine 10 mg tablet    . ibuprofen (ADVIL,MOTRIN) 800 MG tablet Take 1 tablet (800 mg total) by mouth every 8 (eight) hours as needed. 30 tablet 5  . Norethindrone-Ethinyl Estradiol-Fe (GENERESS FE) 0.8-25 MG-MCG tablet Chew 1 tablet by mouth daily. 3 Package 4  . clotrimazole (LOTRIMIN) 1 % cream Apply 1 application topically 2 (two) times daily. (Patient not taking: Reported on 12/30/2017) 30 g 0  . levonorgestrel-ethinyl estradiol (KURVELO) 0.15-30 MG-MCG tablet [place] Fe 0.8 mg-25 mcg (24)/75 mg (4) chewable tablet    . rizatriptan (MAXALT) 5 MG tablet Take 1 tablet (5 mg total) by mouth as needed for migraine. May repeat in 2 hours if needed (Patient not taking: Reported on 11/07/2018) 10 tablet 0  . terconazole (TERAZOL 3) 0.8 % vaginal cream Place 1 applicator vaginally at bedtime. (Patient not taking: Reported on 11/07/2018) 20 g 0   No current facility-administered medications for this visit.     Review of Systems Review of Systems Constitutional: negative for fatigue and weight loss Respiratory: negative for cough and  wheezing Cardiovascular: negative for chest pain, fatigue and palpitations Gastrointestinal: negative for abdominal pain and change in bowel habits Genitourinary:positive for cramping before period Integument/breast: negative for nipple discharge Musculoskeletal:negative for myalgias Neurological: negative for gait problems and tremors Behavioral/Psych: negative for abusive relationship, depression Endocrine: negative for temperature intolerance      Blood pressure 120/84, pulse 83, weight 124 lb 9.6 oz (56.5 kg), last menstrual period 11/04/2018.  Physical Exam Physical Exam           General:  Alert and no distress Abdomen:  normal findings: no organomegaly, soft, non-tender and no hernia  Pelvis:  External genitalia: normal general appearance Urinary system: urethral meatus normal and bladder without fullness, nontender Vaginal: normal without tenderness, induration or masses Cervix: normal appearance Adnexa: normal bimanual exam Uterus: anteverted and non-tender, normal size    50% of 20 min visit spent on counseling and coordination of care.   Data Reviewed Wet Prep and Culture  Assessment     1. Pelvic pain Rx: - US PELVIC COMPLETE WITH TRANSVAGINAL; Future  2. Dysmenorrhea - Ibuprofen Rx  3. Dysuria Rx: - POCT Urinalysis Dipstick  4. Screen for STD (sexually transmitted disease) Rx: - Cervicovaginal ancillary only( [place])    Plan    Orders Placed This Encounter  Procedures  . US PELVIC COMPLETE WITH TRANSVAGINAL    Wt. 124 Ins. bcbs No needs FH w Annetra Pt aware of prep and 301    Standing Status:  Future    Standing Expiration Date:   01/07/2020    Order Specific Question:   Reason for Exam (SYMPTOM  OR DIAGNOSIS REQUIRED)    Answer:   Pelvic pain    Order Specific Question:   Preferred imaging location?    Answer:   [id number]  . POCT Urinalysis Dipstick   No orders of the defined types were placed in this encounter.   Brock Bad MD 11-07-2018

## 2018-11-07 NOTE — Progress Notes (Signed)
Pt is here for f/u from 3 months ago. Her previous symptoms were abdominal pain before cycles. Pt reports cramping and pain before period has not improved with ibuprofen 800 mg, she states the ibuprofen helps while she is on her period though.

## 2018-11-10 LAB — CERVICOVAGINAL ANCILLARY ONLY
Bacterial vaginitis: NEGATIVE
Candida vaginitis: NEGATIVE
Chlamydia: NEGATIVE
Neisseria Gonorrhea: NEGATIVE
Trichomonas: NEGATIVE

## 2018-11-11 ENCOUNTER — Ambulatory Visit: Payer: BLUE CROSS/BLUE SHIELD | Admitting: Obstetrics

## 2018-11-14 ENCOUNTER — Other Ambulatory Visit: Payer: BLUE CROSS/BLUE SHIELD

## 2018-11-20 ENCOUNTER — Ambulatory Visit
Admission: RE | Admit: 2018-11-20 | Discharge: 2018-11-20 | Disposition: A | Discharge status: Home / Self Care | Payer: BLUE CROSS/BLUE SHIELD | Source: Ambulatory Visit | Attending: Obstetrics | Admitting: Obstetrics

## 2018-11-20 DIAGNOSIS — R102 Pelvic and perineal pain: Secondary | ICD-10-CM

## 2018-11-21 ENCOUNTER — Other Ambulatory Visit: Payer: BLUE CROSS/BLUE SHIELD

## 2018-12-01 DIAGNOSIS — R8281 Pyuria: Secondary | ICD-10-CM | POA: Diagnosis not present

## 2018-12-01 DIAGNOSIS — N760 Acute vaginitis: Secondary | ICD-10-CM | POA: Diagnosis not present

## 2019-02-04 ENCOUNTER — Encounter: Payer: Self-pay | Admitting: Obstetrics & Gynecology

## 2019-02-04 ENCOUNTER — Ambulatory Visit: Payer: BC Managed Care – PPO | Admitting: Obstetrics & Gynecology

## 2019-02-04 ENCOUNTER — Other Ambulatory Visit: Payer: Self-pay

## 2019-02-04 VITALS — BP 116/82 | HR 109 | Wt 117.5 lb

## 2019-02-04 DIAGNOSIS — R350 Frequency of micturition: Secondary | ICD-10-CM | POA: Diagnosis not present

## 2019-02-04 DIAGNOSIS — N760 Acute vaginitis: Secondary | ICD-10-CM | POA: Diagnosis not present

## 2019-02-04 DIAGNOSIS — Z113 Encounter for screening for infections with a predominantly sexual mode of transmission: Secondary | ICD-10-CM | POA: Diagnosis not present

## 2019-02-04 DIAGNOSIS — B9689 Other specified bacterial agents as the cause of diseases classified elsewhere: Secondary | ICD-10-CM | POA: Diagnosis not present

## 2019-02-04 DIAGNOSIS — N898 Other specified noninflammatory disorders of vagina: Secondary | ICD-10-CM

## 2019-02-04 NOTE — Progress Notes (Signed)
Patient ID: Katherine Rodgers, female   DOB: [birth year], 23 y.o.   MRN: [id number]  Chief Complaint  Patient presents with  . Gyn   Vaginal discharge HPI Katherine Rodgers is a 23 y.o. female.  [id number] Patient's last menstrual period was 01/27/2019. Several days with vaginal odor and discharge, no pain, bleeding. She requests STD screening HPI  Past Medical History:  Diagnosis Date  . Eczema   . Migraine   . Urinary frequency     No past surgical history on file.  Family History  Problem Relation Age of Onset  . Hypertension Mother     Social History Social History   Tobacco Use  . Smoking status: Never Smoker  . Smokeless tobacco: Never Used  Substance Use Topics  . Alcohol use: No    Alcohol/week: 0.0 standard drinks  . Drug use: No    Allergies  Allergen Reactions  . Augmentin [Amoxicillin-Pot Clavulanate] Nausea And Vomiting    Current Outpatient Medications  Medication Sig Dispense Refill  . cyclobenzaprine (FLEXERIL) 10 MG tablet cyclobenzaprine 10 mg tablet    . ibuprofen (ADVIL,MOTRIN) 800 MG tablet Take 1 tablet (800 mg total) by mouth every 8 (eight) hours as needed. 30 tablet 5  . Norethindrone-Ethinyl Estradiol-Fe (GENERESS FE) 0.8-25 MG-MCG tablet Chew 1 tablet by mouth daily. 3 Package 4  . clotrimazole (LOTRIMIN) 1 % cream Apply 1 application topically 2 (two) times daily. (Patient not taking: Reported on 12/30/2017) 30 g 0  . levonorgestrel-ethinyl estradiol (KURVELO) 0.15-30 MG-MCG tablet Generess Fe 0.8 mg-25 mcg (24)/75 mg (4) chewable tablet    . rizatriptan (MAXALT) 5 MG tablet Take 1 tablet (5 mg total) by mouth as needed for migraine. May repeat in 2 hours if needed (Patient not taking: Reported on 11/07/2018) 10 tablet 0  . terconazole (TERAZOL 3) 0.8 % vaginal cream Place 1 applicator vaginally at bedtime. (Patient not taking: Reported on 11/07/2018) 20 g 0   No current facility-administered medications for this visit.     Review of Systems Review of  Systems  Constitutional: Negative.   Gastrointestinal: Negative.   Genitourinary: Positive for vaginal discharge. Negative for pelvic pain, vaginal bleeding and vaginal pain.    Blood pressure 116/82, pulse (!) 109, weight 117 lb 8 oz (53.3 kg), last menstrual period 01/27/2019.  Physical Exam Physical Exam Vitals signs and nursing note reviewed.  Constitutional:      Appearance: She is not ill-appearing.  Abdominal:     General: Abdomen is flat.  Genitourinary:    General: Normal vulva.     Vagina: Vaginal discharge (scant dischage) present.  Skin:    General: Skin is warm and dry.  Neurological:     Mental Status: She is alert.  Psychiatric:        Mood and Affect: Mood normal.     Data Reviewed Labs, pap result  Assessment Sx vaginal discharge, STD screen requested  Plan Orders Placed This Encounter  Procedures  . Urine Culture  . HIV Antibody (routine testing w rflx)  . Hepatitis B surface antigen   She will be notified of result via MyChart    Scheryl Darter 02/04/2019, 9:11 AM

## 2019-02-04 NOTE — Progress Notes (Signed)
Pt is in the office for vaginal swab. Pt complains of smelly discharge, no irritation. Pt wants std testing. Pt also complains of urinary frequency, left urine sample.

## 2019-02-04 NOTE — Patient Instructions (Signed)
Vaginitis  Vaginitis is a condition in which the vaginal tissue swells and becomes red (inflamed). This condition is most often caused by a change in the normal balance of bacteria and yeast that live in the vagina. This change causes an overgrowth of certain bacteria or yeast, which causes the inflammation. There are different types of vaginitis, but the most common types are:   Bacterial vaginosis.   Yeast infection (candidiasis).   Trichomoniasis vaginitis. This is a sexually transmitted disease (STD).   Viral vaginitis.   Atrophic vaginitis.   Allergic vaginitis.  What are the causes?  The cause of this condition depends on the type of vaginitis. It can be caused by:   Bacteria (bacterial vaginosis).   Yeast, which is a fungus (yeast infection).   A parasite (trichomoniasis vaginitis).   A virus (viral vaginitis).   Low hormone levels (atrophic vaginitis). Low hormone levels can occur during pregnancy, breastfeeding, or after menopause.   Irritants, such as bubble baths, scented tampons, and feminine sprays (allergic vaginitis).  Other factors can change the normal balance of the yeast and bacteria that live in the vagina. These include:   Antibiotic medicines.   Poor hygiene.   Diaphragms, vaginal sponges, spermicides, birth control pills, and intrauterine devices (IUD).   Sex.   Infection.   Uncontrolled diabetes.   A weakened defense (immune) system.  What increases the risk?  This condition is more likely to develop in women who:   Smoke.   Use vaginal douches, scented tampons, or scented sanitary pads.   Wear tight-fitting pants.   Wear thong underwear.   Use oral birth control pills or an IUD.   Have sex without a condom.   Have multiple sex partners.   Have an STD.   Frequently use the spermicide nonoxynol-9.   Eat lots of foods high in sugar.   Have uncontrolled diabetes.   Have low estrogen levels.   Have a weakened immune system from an immune disorder or medical  treatment.   Are pregnant or breastfeeding.  What are the signs or symptoms?  Symptoms vary depending on the cause of the vaginitis. Common symptoms include:   Abnormal vaginal discharge.  ? The discharge is white, gray, or yellow with bacterial vaginosis.  ? The discharge is thick, white, and cheesy with a yeast infection.  ? The discharge is frothy and yellow or greenish with trichomoniasis.   A bad vaginal smell. The smell is fishy with bacterial vaginosis.   Vaginal itching, pain, or swelling.   Sex that is painful.   Pain or burning when urinating.  Sometimes there are no symptoms.  How is this diagnosed?  This condition is diagnosed based on your symptoms and medical history. A physical exam, including a pelvic exam, will also be done. You may also have other tests, including:   Tests to determine the pH level (acidity or alkalinity) of your vagina.   A whiff test, to assess the odor that results when a sample of your vaginal discharge is mixed with a potassium hydroxide solution.   Tests of vaginal fluid. A sample will be examined under a microscope.  How is this treated?  Treatment varies depending on the type of vaginitis you have. Your treatment may include:   Antibiotic creams or pills to treat bacterial vaginosis and trichomoniasis.   Antifungal medicines, such as vaginal creams or suppositories, to treat a yeast infection.   Medicine to ease discomfort if you have viral vaginitis. Your sexual partner   should also be treated.   Estrogen delivered in a cream, pill, suppository, or vaginal ring to treat atrophic vaginitis. If vaginal dryness occurs, lubricants and moisturizing creams may help. You may need to avoid scented soaps, sprays, or douches.   Stopping use of a product that is causing allergic vaginitis. Then using a vaginal cream to treat the symptoms.  Follow these instructions at home:  Lifestyle   Keep your genital area clean and dry. Avoid soap, and only rinse the area with  water.   Do not douche or use tampons until your health care provider says it is okay to do so. Use sanitary pads, if needed.   Do not have sex until your health care provider approves. When you can return to sex, practice safe sex and use condoms.   Wipe from front to back. This avoids the spread of bacteria from the rectum to the vagina.  General instructions   Take over-the-counter and prescription medicines only as told by your health care provider.   If you were prescribed an antibiotic medicine, take or use it as told by your health care provider. Do not stop taking or using the antibiotic even if you start to feel better.   Keep all follow-up visits as told by your health care provider. This is important.  How is this prevented?   Use mild, non-scented products. Do not use things that can irritate the vagina, such as fabric softeners. Avoid the following products if they are scented:  ? Feminine sprays.  ? Detergents.  ? Tampons.  ? Feminine hygiene products.  ? Soaps or bubble baths.   Let air reach your genital area.  ? Wear cotton underwear to reduce moisture buildup.  ? Avoid wearing underwear while you sleep.  ? Avoid wearing tight pants and underwear or nylons without a cotton panel.  ? Avoid wearing thong underwear.   Take off any wet clothing, such as bathing suits, as soon as possible.   Practice safe sex and use condoms.  Contact a health care provider if:   You have abdominal pain.   You have a fever.   You have symptoms that last for more than 2-3 days.  Get help right away if:   You have a fever and your symptoms suddenly get worse.  Summary   Vaginitis is a condition in which the vaginal tissue becomes inflamed.This condition is most often caused by a change in the normal balance of bacteria and yeast that live in the vagina.   Treatment varies depending on the type of vaginitis you have.   Do not douche, use tampons , or have sex until your health care provider approves. When  you can return to sex, practice safe sex and use condoms.  This information is not intended to replace advice given to you by your health care provider. Make sure you discuss any questions you have with your health care provider.  Document Released: 06/17/2007 Document Revised: 09/25/2016 Document Reviewed: 09/25/2016  Elsevier Interactive Patient Education  2019 [employer]

## 2019-02-05 LAB — HIV ANTIBODY (ROUTINE TESTING W REFLEX): HIV Screen 4th Generation wRfx: NONREACTIVE

## 2019-02-05 LAB — HEPATITIS B SURFACE ANTIGEN: Hepatitis B Surface Ag: NEGATIVE

## 2019-02-06 LAB — URINE CULTURE

## 2019-02-06 LAB — CERVICOVAGINAL ANCILLARY ONLY
Bacterial vaginitis: POSITIVE — AB
Candida vaginitis: NEGATIVE
Chlamydia: NEGATIVE
Neisseria Gonorrhea: NEGATIVE
Trichomonas: NEGATIVE

## 2019-02-08 ENCOUNTER — Other Ambulatory Visit: Payer: Self-pay

## 2019-02-08 ENCOUNTER — Other Ambulatory Visit: Payer: Self-pay | Admitting: Obstetrics & Gynecology

## 2019-02-08 DIAGNOSIS — N898 Other specified noninflammatory disorders of vagina: Secondary | ICD-10-CM

## 2019-02-08 MED ORDER — METRONIDAZOLE 500 MG PO TABS: 500.0000 mg | ORAL_TABLET | Freq: Two times a day (BID) | ORAL | 0 refills | Status: DC

## 2019-02-09 ENCOUNTER — Other Ambulatory Visit: Payer: Self-pay

## 2019-02-09 MED ORDER — FLUCONAZOLE 150 MG PO TABS: 150.0000 mg | ORAL_TABLET | Freq: Once | ORAL | 0 refills | Status: AC

## 2019-03-08 ENCOUNTER — Telehealth: Payer: BC Managed Care – PPO | Admitting: Family

## 2019-03-08 DIAGNOSIS — K591 Functional diarrhea: Secondary | ICD-10-CM

## 2019-03-08 NOTE — Progress Notes (Signed)
Based on what you shared with me, I feel your condition warrants further evaluation and I recommend that you be seen for a face to face office visit.  Treatment of your symptoms varies based off of your pregnancy status. It is best to have you seen so that we can proceed safely  NOTE: If you entered your credit card information for this eVisit, you will not be charged. You may see a "hold" on your card for the $35 but that hold will drop off and you will not have a charge processed.  If you are having a true medical emergency please call 911.     For an urgent face to face visit, [facility] has five urgent care centers for your convenience:    [email or web address] to reserve your spot online an avoid wait times  [facility] [address] [place], [place] [address] Modified hours of operation: Monday-Friday, 12 PM to 6 PM  Closed Saturday & Sunday  *Across the street from [place] (New Address[address] [place], [place] [address] *Just off [address], across the road from [place] hours of operation: Monday-Friday, 12 PM to 6 PM  Closed Saturday & Sunday   The following sites will take your insurance:  . [facility]    [phone]                  Get Driving Directions  [address], [place] [address] . 10 am to 8 pm Monday-Friday . 12 pm to 8 pm Saturday-Sunday   . [facility] [place]                  Get Driving Directions  [address], [place], [place] [address] . 8 am to 8 pm Monday-Friday . 9 am to 6 pm Saturday . 11 am to 6 pm Sunday   . [facility] at [place]                  Get Driving Directions   [address] [place], [place] [address] . 8 am to 8 pm Monday-Friday . 8 am to 4 pm Saturday-Sunday    . [facility] at [place]                    Get Driving Directions  [phone]  [address], [place], [place] [address]  . Monday-Friday, 12 PM to 6 PM    Your e-visit answers were reviewed by a board certified advanced clinical practitioner to complete your personal care plan.  Thank you for using e-Visits.

## 2019-03-17 ENCOUNTER — Emergency Department (HOSPITAL_COMMUNITY): Payer: BC Managed Care – PPO

## 2019-03-17 ENCOUNTER — Other Ambulatory Visit: Payer: Self-pay

## 2019-03-17 ENCOUNTER — Emergency Department (HOSPITAL_COMMUNITY)
Admission: EM | Admit: 2019-03-17 | Discharge: 2019-03-18 | Disposition: A | Discharge status: Home / Self Care | Payer: BC Managed Care – PPO | Attending: Emergency Medicine | Admitting: Emergency Medicine

## 2019-03-17 ENCOUNTER — Encounter (HOSPITAL_COMMUNITY): Payer: Self-pay

## 2019-03-17 DIAGNOSIS — Z79899 Other long term (current) drug therapy: Secondary | ICD-10-CM | POA: Diagnosis not present

## 2019-03-17 DIAGNOSIS — R1032 Left lower quadrant pain: Secondary | ICD-10-CM | POA: Diagnosis not present

## 2019-03-17 DIAGNOSIS — R1084 Generalized abdominal pain: Secondary | ICD-10-CM | POA: Diagnosis not present

## 2019-03-17 DIAGNOSIS — R109 Unspecified abdominal pain: Secondary | ICD-10-CM | POA: Diagnosis not present

## 2019-03-17 LAB — COMPREHENSIVE METABOLIC PANEL
ALT: 36 U/L (ref 0–44)
AST: 26 U/L (ref 15–41)
Albumin: 3.6 g/dL (ref 3.5–5.0)
Alkaline Phosphatase: 46 U/L (ref 38–126)
Anion gap: 9 (ref 5–15)
BUN: 5 mg/dL — ABNORMAL LOW (ref 6–20)
CO2: 20 mmol/L — ABNORMAL LOW (ref 22–32)
Calcium: 8.8 mg/dL — ABNORMAL LOW (ref 8.9–10.3)
Chloride: 106 mmol/L (ref 98–111)
Creatinine, Ser: 0.74 mg/dL (ref 0.44–1.00)
GFR calc Af Amer: >60 mL/min (ref >60)
GFR calc non Af Amer: >60 mL/min (ref >60)
Glucose, Bld: 76 mg/dL (ref 70–99)
Potassium: 3.9 mmol/L (ref 3.5–5.1)
Sodium: 135 mmol/L (ref 135–145)
Total Bilirubin: 0.6 mg/dL (ref 0.3–1.2)
Total Protein: 7 g/dL (ref 6.5–8.1)

## 2019-03-17 LAB — URINALYSIS, ROUTINE W REFLEX MICROSCOPIC
Bilirubin Urine: NEGATIVE
Glucose, UA: NEGATIVE mg/dL
Hgb urine dipstick: NEGATIVE
Ketones, ur: 20 mg/dL — AB
Nitrite: NEGATIVE
Protein, ur: NEGATIVE mg/dL
Specific Gravity, Urine: 1.016 (ref 1.005–1.030)
pH: 5 (ref 5.0–8.0)

## 2019-03-17 LAB — CBC
HCT: 42.7 % (ref 36.0–46.0)
Hemoglobin: 14.3 g/dL (ref 12.0–15.0)
MCH: 29.1 pg (ref 26.0–34.0)
MCHC: 33.5 g/dL (ref 30.0–36.0)
MCV: 86.8 fL (ref 80.0–100.0)
Platelets: 344 10*3/uL (ref 150–400)
RBC: 4.92 MIL/uL (ref 3.87–5.11)
RDW: 12.9 % (ref 11.5–15.5)
WBC: 6.3 10*3/uL (ref 4.0–10.5)
nRBC: 0 % (ref 0.0–0.2)

## 2019-03-17 LAB — I-STAT BETA HCG BLOOD, ED (MC, WL, AP ONLY): I-stat hCG, quantitative: <5 m[IU]/mL (ref <5)

## 2019-03-17 LAB — LIPASE, BLOOD: Lipase: 26 U/L (ref 11–51)

## 2019-03-17 MED ORDER — KETOROLAC TROMETHAMINE 10 MG PO TABS
10.0000 mg | ORAL_TABLET | Freq: Once | ORAL | Status: AC
  Administered 2019-03-18: 10 mg via ORAL
  Filled 2019-03-17: qty 1

## 2019-03-17 MED ORDER — SODIUM CHLORIDE 0.9% FLUSH
3.0000 mL | Freq: Once | INTRAVENOUS | Status: AC
  Administered 2019-03-18: 3 mL via INTRAVENOUS

## 2019-03-17 NOTE — ED Notes (Signed)
Patient transported to Ultrasound 

## 2019-03-17 NOTE — ED Triage Notes (Signed)
Pt reports LLQ pain and urinary burning for the past week, no n/v. Pt seen at [facility] and was told she didn't have a UTI and she was not pregnant. Pt sent here for further evaluation.

## 2019-03-17 NOTE — ED Provider Notes (Signed)
[facility] EMERGENCY DEPARTMENT Provider Note   CSN: [id number] Arrival date & time: 03/17/19  2044    History   Chief Complaint Chief Complaint  Patient presents with  . Abdominal Pain    HPI Katherine Rodgers is a 23 y.o. female.     23 year old female presents to the emergency department from urgent care for further evaluation of left lower quadrant abdominal pain.  She has been experiencing pain for approximately 3 days.  She describes the pain as a pressure-like sensation which has worsened to progressive sharp pains.  She had some temporary improvement in her pain with ibuprofen, but this only lasted for about an hour.  Denies any known aggravating factors of her discomfort.  Has had urinary frequency, but reports this is typically her baseline.  No fevers, vomiting, diarrhea, melena or hematochezia, hematuria.  She is sexually active with one female partner.  Intermittently uses condoms, but denies concern for STDs.  Had a negative STD screening 1 month ago.  No history of abdominal surgeries.  The history is provided by the patient. No language interpreter was used.  Abdominal Pain   Past Medical History:  Diagnosis Date  . Eczema   . Migraine   . Urinary frequency     Patient Active Problem List   Diagnosis Date Noted  . Acute non intractable tension-type headache 03/08/2017    History reviewed. No pertinent surgical history.   OB History    Gravida  0   Para  0   Term  0   Preterm  0   AB  0   Living  0     SAB  0   TAB  0   Ectopic  0   Multiple  0   Live Births  0            Home Medications    Prior to Admission medications   Medication Sig Start Date End Date Taking? Authorizing Provider  clotrimazole (LOTRIMIN) 1 % cream Apply 1 application topically 2 (two) times daily. Patient not taking: Reported on 12/30/2017 10/03/17   Shelly Bombard, MD  cyclobenzaprine (FLEXERIL) 10 MG tablet cyclobenzaprine 10 mg tablet     [provider]  ibuprofen (ADVIL,MOTRIN) 800 MG tablet Take 1 tablet (800 mg total) by mouth every 8 (eight) hours as needed. 08/07/18   Shelly Bombard, MD  levonorgestrel-ethinyl estradiol Fortunato Curling) 0.15-30 MG-MCG tablet Generess Fe 0.8 mg-25 mcg (24)/75 mg (4) chewable tablet    [provider]  metroNIDAZOLE (FLAGYL) 500 MG tablet Take 1 tablet (500 mg total) by mouth 2 (two) times daily. 02/08/19   Woodroe Mode, MD  Norethindrone-Ethinyl Estradiol-Fe (GENERESS FE) 0.8-25 MG-MCG tablet Chew 1 tablet by mouth daily. 04/18/18   Shelly Bombard, MD  rizatriptan (MAXALT) 5 MG tablet Take 1 tablet (5 mg total) by mouth as needed for migraine. May repeat in 2 hours if needed Patient not taking: Reported on 11/07/2018 03/07/17   Sela Hilding, MD  terconazole (TERAZOL 3) 0.8 % vaginal cream Place 1 applicator vaginally at bedtime. Patient not taking: Reported on 11/07/2018 10/03/17   Shelly Bombard, MD    Family History Family History  Problem Relation Age of Onset  . Hypertension Mother     Social History Social History   Tobacco Use  . Smoking status: Never Smoker  . Smokeless tobacco: Never Used  Substance Use Topics  . Alcohol use: No    Alcohol/week: 0.0 standard drinks  .  Drug use: No     Allergies   Augmentin [amoxicillin-pot clavulanate]   Review of Systems Review of Systems  Gastrointestinal: Positive for abdominal pain.  Ten systems reviewed and are negative for acute change, except as noted in the HPI.    Physical Exam Updated Vital Signs BP (!) 106/91   Pulse 88   Temp 98.3 F (36.8 C) (Oral)   Resp 18   SpO2 100%   Physical Exam Vitals signs and nursing note reviewed.  Constitutional:      General: She is not in acute distress.    Appearance: She is well-developed. She is not diaphoretic.     Comments: Nontoxic appearing and in NAD  HENT:     Head: Normocephalic and atraumatic.  Eyes:     General: No scleral icterus.     Conjunctiva/sclera: Conjunctivae normal.  Neck:     Musculoskeletal: Normal range of motion.  Cardiovascular:     Rate and Rhythm: Normal rate and regular rhythm.     Pulses: Normal pulses.  Pulmonary:     Effort: Pulmonary effort is normal. No respiratory distress.     Comments: Respirations even and unlabored Abdominal:     Palpations: Abdomen is soft. There is no mass.     Tenderness: There is no guarding.     Comments: Patient holding her left lower quadrant as though she is uncomfortable in this area, but has no reproducible tenderness on exam.  Abdomen is soft and without peritoneal signs.  Genitourinary:    Comments: There is cervical friability.  Os closed.  Minimal discharge in the vaginal vault.  No genital sores or lesions. Musculoskeletal: Normal range of motion.  Skin:    General: Skin is warm and dry.     Coloration: Skin is not pale.     Findings: No erythema or rash.  Neurological:     General: No focal deficit present.     Mental Status: She is alert and oriented to person, place, and time.     Coordination: Coordination normal.  Psychiatric:        Behavior: Behavior normal.      ED Treatments / Results  Labs (all labs ordered are listed, but only abnormal results are displayed) Labs Reviewed  WET PREP, GENITAL - Abnormal; Notable for the following components:      Result Value   WBC, Wet Prep HPF POC FEW (*)    All other components within normal limits  COMPREHENSIVE METABOLIC PANEL - Abnormal; Notable for the following components:   CO2 20 (*)    BUN 5 (*)    Calcium 8.8 (*)    All other components within normal limits  URINALYSIS, ROUTINE W REFLEX MICROSCOPIC - Abnormal; Notable for the following components:   APPearance HAZY (*)    Ketones, ur 20 (*)    Leukocytes,Ua TRACE (*)    Bacteria, UA RARE (*)    All other components within normal limits  LIPASE, BLOOD  CBC  I-STAT BETA HCG BLOOD, ED ([facility], [facility], [facility] ONLY)  GC/CHLAMYDIA PROBE AMP ([facility]    EKG None  Radiology [place] Pelvic Complete W Transvaginal And Torsion R/o  Result Date: 03/18/2019 CLINICAL DATA:  23 y/o  F; left lower quadrant pain for 1 week. EXAM: TRANSABDOMINAL AND TRANSVAGINAL ULTRASOUND OF PELVIS DOPPLER ULTRASOUND OF OVARIES TECHNIQUE: Both transabdominal and transvaginal ultrasound examinations of the pelvis were performed. Transabdominal technique was performed for global imaging of the pelvis including uterus,  ovaries, adnexal regions, and pelvic cul-de-sac. It was necessary to proceed with endovaginal exam following the transabdominal exam to visualize the adnexa. Color and duplex Doppler ultrasound was utilized to evaluate blood flow to the ovaries. COMPARISON:  None. FINDINGS: Uterus Measurements: 6.2 x 2.9 x 3.2 cm = volume: 29.6 mL. No fibroids or other mass visualized. Endometrium Thickness: 3.3.  No focal abnormality visualized. Right ovary Measurements: 2.3 x 1.7 x 1.8 cm = volume: 3.7 mL. Normal appearance/no adnexal mass. Left ovary Measurements: 2.0 x 1.3 x 2.1 cm = volume: 2.8 mL. Normal appearance/no adnexal mass. Pulsed Doppler evaluation of both ovaries demonstrates normal low-resistance arterial and venous waveforms. Other findings No abnormal free fluid. IMPRESSION: Normal pelvic ultrasound. Electronically Signed   By: Mitzi HansenLance  Furusawa-Stratton M.D.   On: 03/18/2019 00:28    Procedures Procedures (including critical care time)  Medications Ordered in ED Medications  sodium chloride flush (NS) 0.9 % injection 3 mL (3 mLs Intravenous Given 03/18/19 0043)  ketorolac (TORADOL) tablet 10 mg (10 mg Oral Given 03/18/19 0042)     Initial Impression / Assessment and Plan / ED Course  I have reviewed the triage vital signs and the nursing notes.  Pertinent labs & imaging results that were available during my care of the patient were reviewed by me and considered in my medical decision making (see chart for details).         23 year old female presenting to the emergency department for further evaluation of left lower abdominal pain.  She reports urinary frequency and urgency, but has had urgency and frequency for a number of years.  She denies dysuria.  No bowel changes or vomiting.  She is afebrile in the ED today.  Denies preceding fevers.  Vitals stable.  Patient underwent pelvic ultrasound to assess for possible ovarian cyst versus TOA versus torsion.  Imaging is reassuring.  Patient denies concern for STDs given recent negative STD panel.  Her wet prep is not concerning for STIs and her clinical picture is inconsistent with pelvic inflammatory disease.  Pregnancy is negative.  No evidence of UTI today.  No leukocytosis or fever to suggest other infectious etiology; patient does not meet criteria for SIRS/Sepsis.  Low suspicion for emergent or surgical process in the abdomen or pelvis. Plan on continued symptomatic management with Tylenol and/or ibuprofen.  Have encouraged PCP follow up to ensure resolution of symptoms. Return precautions discussed and provided. Patient discharged in stable condition with no unaddressed concerns.   Final Clinical Impressions(s) / ED Diagnoses   Final diagnoses:  Left lower quadrant abdominal pain    ED Discharge Orders    None       Antony MaduraHumes, Edenilson Austad, PA-C 03/18/19 0226    Nira Connardama, Pedro Eduardo, MD 03/18/19 [phone]

## 2019-03-18 DIAGNOSIS — R1032 Left lower quadrant pain: Secondary | ICD-10-CM | POA: Diagnosis not present

## 2019-03-18 LAB — WET PREP, GENITAL
Clue Cells Wet Prep HPF POC: NONE SEEN
Sperm: NONE SEEN
Trich, Wet Prep: NONE SEEN
Yeast Wet Prep HPF POC: NONE SEEN

## 2019-03-18 LAB — GC/CHLAMYDIA PROBE AMP (~~LOC~~) NOT AT ARMC
Chlamydia: NEGATIVE
Neisseria Gonorrhea: NEGATIVE

## 2019-03-18 NOTE — Discharge Instructions (Signed)
We recommend Tylenol/acetaminophen for pain. You can take this medication with ibuprofen/Advil/Motrin OR with Aleve/Naproxen for persistent symptoms.  Take as instructed on the bottle.  If symptoms remain persistent, you may benefit from the use of daily MiraLAX to ensure that you are having regular bowel movements.  Follow-up with a primary care doctor to ensure resolution of symptoms.

## 2019-03-18 NOTE — ED Notes (Signed)
Returned from U/S

## 2019-04-02 DIAGNOSIS — N898 Other specified noninflammatory disorders of vagina: Secondary | ICD-10-CM

## 2019-04-02 MED ORDER — FLUCONAZOLE 150 MG PO TABS: 150.0000 mg | ORAL_TABLET | Freq: Once | ORAL | 1 refills | Status: AC

## 2019-04-02 MED ORDER — METRONIDAZOLE 500 MG PO TABS: 500.0000 mg | ORAL_TABLET | Freq: Two times a day (BID) | ORAL | 0 refills | Status: DC

## 2019-04-04 DIAGNOSIS — Z20828 Contact with and (suspected) exposure to other viral communicable diseases: Secondary | ICD-10-CM | POA: Diagnosis not present

## 2019-04-06 ENCOUNTER — Ambulatory Visit (HOSPITAL_COMMUNITY)
Admission: EM | Admit: 2019-04-06 | Discharge: 2019-04-06 | Disposition: A | Discharge status: Home / Self Care | Payer: BC Managed Care – PPO | Attending: Family Medicine | Admitting: Family Medicine

## 2019-04-06 ENCOUNTER — Encounter (HOSPITAL_COMMUNITY): Payer: Self-pay | Admitting: Emergency Medicine

## 2019-04-06 ENCOUNTER — Other Ambulatory Visit: Payer: Self-pay

## 2019-04-06 DIAGNOSIS — N760 Acute vaginitis: Secondary | ICD-10-CM | POA: Diagnosis not present

## 2019-04-06 DIAGNOSIS — Z20828 Contact with and (suspected) exposure to other viral communicable diseases: Secondary | ICD-10-CM | POA: Insufficient documentation

## 2019-04-06 DIAGNOSIS — R05 Cough: Secondary | ICD-10-CM | POA: Diagnosis not present

## 2019-04-06 DIAGNOSIS — R059 Cough, unspecified: Secondary | ICD-10-CM

## 2019-04-06 MED ORDER — BENZONATATE 200 MG PO CAPS: 200.0000 mg | ORAL_CAPSULE | Freq: Three times a day (TID) | ORAL | 0 refills | Status: AC | PRN

## 2019-04-06 MED ORDER — CETIRIZINE HCL 10 MG PO CAPS: 10.0000 mg | ORAL_CAPSULE | Freq: Every day | ORAL | 0 refills | Status: DC

## 2019-04-06 MED ORDER — FLUCONAZOLE 150 MG PO TABS: 150.0000 mg | ORAL_TABLET | Freq: Once | ORAL | 0 refills | Status: AC

## 2019-04-06 NOTE — Discharge Instructions (Signed)
Tessalon for cough as needed every 8 hours Daily cetirizine to help with drainage triggering cough- use for the next 10 days Follow up if cough not resolving or worsening  Swab pending for cause of irritation May use diflucan to treat for yeast

## 2019-04-06 NOTE — ED Triage Notes (Signed)
Pt here for vaginal discharge was seen via tele visit and given meds for BV and yeast which she did not take; pt also sts cough has negative covid test

## 2019-04-07 LAB — SARS CORONAVIRUS 2 (TAT 6-24 HRS): SARS Coronavirus 2: NEGATIVE

## 2019-04-07 NOTE — ED Provider Notes (Addendum)
[facility]    CSN: [id number] Arrival date & time: 04/06/19  1945      History   Chief Complaint Chief Complaint  Patient presents with  . Vaginal Discharge  . Cough    HPI Katherine Rodgers is a 23 y.o. female history of migraines, eczema, presenting today for evaluation of cough and vaginal discharge.  Patient states that she has had some irritation and vaginal discharge over the the past week.  She notes that she is also noticed a small cut to the perineum area, denies ulcer/blister or clustering of lesions.  This is uncomfortable with urination.  She denies any pelvic pain or abdominal pain.  Denies fevers, nausea or vomiting.  She states that she has had previous yeast and BV.  Most recently has tested positive for BV.  She states that typically 1 tablet of Diflucan does not fully treat her yeast infections.  She has received Flagyl from her OB/GYN, but has not started this yet as she wanted to make sure the cause of her symptoms.  Denies concerns for STDs.  On oral contraceptives.  She has also had a dry cough since Wednesday.  Has tested negative for COVID.  Cough is nonproductive.  Denies shortness of breath or chest pain.  Denies associated congestion or sore throat.  Denies fevers chills or body aches.  She has not used any medicines for symptoms.  Denies history of asthma or or tobacco use.  HPI  Past Medical History:  Diagnosis Date  . Eczema   . Migraine   . Urinary frequency     Patient Active Problem List   Diagnosis Date Noted  . Acute non intractable tension-type headache 03/08/2017    History reviewed. No pertinent surgical history.  OB History    Gravida  0   Para  0   Term  0   Preterm  0   AB  0   Living  0     SAB  0   TAB  0   Ectopic  0   Multiple  0   Live Births  0            Home Medications    Prior to Admission medications   Medication Sig Start Date End Date Taking? Authorizing Provider  benzonatate  (TESSALON) 200 MG capsule Take 1 capsule (200 mg total) by mouth 3 (three) times daily as needed for up to 7 days for cough. 04/06/19 04/13/19  ,  C, PA-C  Cetirizine HCl 10 MG CAPS Take 1 capsule (10 mg total) by mouth daily for 10 days. 04/06/19 04/16/19  ,  C, PA-C  cyclobenzaprine (FLEXERIL) 10 MG tablet cyclobenzaprine 10 mg tablet    [provider]  ibuprofen (ADVIL,MOTRIN) 800 MG tablet Take 1 tablet (800 mg total) by mouth every 8 (eight) hours as needed. 08/07/18   Shelly Bombard, MD  levonorgestrel-ethinyl estradiol Fortunato Curling) 0.15-30 MG-MCG tablet Generess Fe 0.8 mg-25 mcg (24)/75 mg (4) chewable tablet    [provider]  metroNIDAZOLE (FLAGYL) 500 MG tablet Take 1 tablet (500 mg total) by mouth 2 (two) times daily. 04/02/19   Shelly Bombard, MD  Norethindrone-Ethinyl Estradiol-Fe (GENERESS FE) 0.8-25 MG-MCG tablet Chew 1 tablet by mouth daily. 04/18/18   Shelly Bombard, MD  rizatriptan (MAXALT) 5 MG tablet Take 1 tablet (5 mg total) by mouth as needed for migraine. May repeat in 2 hours if needed Patient not taking: Reported on 11/07/2018 03/07/17  Garth Bignessimberlake, Kathryn, MD    Family History Family History  Problem Relation Age of Onset  . Hypertension Mother     Social History Social History   Tobacco Use  . Smoking status: Never Smoker  . Smokeless tobacco: Never Used  Substance Use Topics  . Alcohol use: No    Alcohol/week: 0.0 standard drinks  . Drug use: No     Allergies   Augmentin [amoxicillin-pot clavulanate]   Review of Systems Review of Systems  Constitutional: Negative for activity change, appetite change, chills, fatigue and fever.  HENT: Negative for congestion, ear pain, rhinorrhea, sinus pressure, sore throat and trouble swallowing.   Eyes: Negative for discharge and redness.  Respiratory: Positive for cough. Negative for chest tightness and shortness of breath.   Cardiovascular: Negative for chest pain.   Gastrointestinal: Negative for abdominal pain, diarrhea, nausea and vomiting.  Genitourinary: Positive for vaginal discharge. Negative for dysuria, flank pain, genital sores, hematuria, menstrual problem, vaginal bleeding and vaginal pain.  Musculoskeletal: Negative for back pain and myalgias.  Skin: Negative for rash.  Neurological: Negative for dizziness, light-headedness and headaches.     Physical Exam Triage Vital Signs ED Triage Vitals  Enc Vitals Group     BP 04/06/19 1957 111/80     Pulse Rate 04/06/19 1957 72     Resp 04/06/19 1957 16     Temp 04/06/19 1957 98.3 F (36.8 C)     Temp Source 04/06/19 1957 Temporal     SpO2 04/06/19 1957 100 %     Weight --      Height --      Head Circumference --      Peak Flow --      Pain Score 04/06/19 2004 0     Pain Loc --      Pain Edu? --      Excl. in GC? --    No data found.  Updated Vital Signs BP 111/80 (BP Location: Left Arm)   Pulse 72   Temp 98.3 F (36.8 C) (Temporal)   Resp 16   SpO2 100%   Visual Acuity Right Eye Distance:   Left Eye Distance:   Bilateral Distance:    Right Eye Near:   Left Eye Near:    Bilateral Near:     Physical Exam Vitals signs and nursing note reviewed.  Constitutional:      General: She is not in acute distress.    Appearance: She is well-developed.     Comments: Sitting comfortably on exam table, no acute distress  HENT:     Head: Normocephalic and atraumatic.     Ears:     Comments: Bilateral ears without tenderness to palpation of external auricle, tragus and mastoid, EAC's without erythema or swelling, TM's with good bony landmarks and cone of light. Non erythematous.     Nose:     Comments: Nasal mucosa pink, no rhinorrhea    Mouth/Throat:     Comments: Oral mucosa pink and moist, no tonsillar enlargement or exudate. Posterior pharynx patent and nonerythematous, no uvula deviation or swelling. Normal phonation. Eyes:     Conjunctiva/sclera: Conjunctivae normal.   Neck:     Musculoskeletal: Neck supple.  Cardiovascular:     Rate and Rhythm: Normal rate and regular rhythm.     Heart sounds: No murmur.  Pulmonary:     Effort: Pulmonary effort is normal. No respiratory distress.     Breath sounds: Normal breath sounds.     Comments:  Breathing comfortably at rest, CTABL, no wheezing, rales or other adventitious sounds auscultated No coughing during visit Abdominal:     Palpations: Abdomen is soft.     Tenderness: There is no abdominal tenderness.  Genitourinary:    Comments: Deferred Musculoskeletal:     Comments: No lower leg swelling erythema, no calf tenderness  Skin:    General: Skin is warm and dry.  Neurological:     Mental Status: She is alert.      UC Treatments / Results  Labs (all labs ordered are listed, but only abnormal results are displayed) Labs Reviewed  SARS CORONAVIRUS 2  CERVICOVAGINAL ANCILLARY ONLY    EKG   Radiology No results found.  Procedures Procedures (including critical care time)  Medications Ordered in UC Medications - No data to display  Initial Impression / Assessment and Plan / UC Course  I have reviewed the triage vital signs and the nursing notes.  Pertinent labs & imaging results that were available during my care of the patient were reviewed by me and considered in my medical decision making (see chart for details).    Patient with negative COVID testing 2 days ago.  Cough likely viral or related to allergies.  Recommending Tessalon as needed and daily cetirizine.  Do not suspect underlying pulmonary etiology at this time, without fever, tachycardia, hypoxia, lungs clear to auscultation.  No signs of DVT.  Patient previously given Flagyl, discharge and irritation likely related to BV versus yeast.  Provided Diflucan for patient to use.  Will call with results of swab when return to confirm cause.  Continue to monitor. Discussed strict return precautions. Patient verbalized understanding and  is agreeable with plan.   Final Clinical Impressions(s) / UC Diagnoses   Final diagnoses:  Cough  Vaginitis and vulvovaginitis     Discharge Instructions     Tessalon for cough as needed every 8 hours Daily cetirizine to help with drainage triggering cough- use for the next 10 days Follow up if cough not resolving or worsening  Swab pending for cause of irritation May use diflucan to treat for yeast   ED Prescriptions    Medication Sig Dispense Auth. Provider   fluconazole (DIFLUCAN) 150 MG tablet Take 1 tablet (150 mg total) by mouth once for 1 dose. 3 tablet ,  C, PA-C   Cetirizine HCl 10 MG CAPS Take 1 capsule (10 mg total) by mouth daily for 10 days. 10 capsule ,  C, PA-C   benzonatate (TESSALON) 200 MG capsule Take 1 capsule (200 mg total) by mouth 3 (three) times daily as needed for up to 7 days for cough. 28 capsule ,  C, PA-C     Controlled Substance Prescriptions [place] Controlled Substance Registry consulted? Not Applicable   Foy Guadal[place],  C, PA-C 04/07/19 0951    Lew Dawes,  C, PA-C 04/07/19 [phone]

## 2019-04-11 ENCOUNTER — Telehealth (HOSPITAL_COMMUNITY): Payer: Self-pay | Admitting: *Deleted

## 2019-04-11 NOTE — Telephone Encounter (Signed)
Pt requesting test results from 04/06/19; informed pt of negative Covid result, but relayed to pt that other testing is not complete yet, and that it can take 5-7 days.  Pt argumentative about length of time it's taking to obtain test results.  Explained to pt that [facility] has no control over testing times, as we simply provide the specimen to the lab. T/C to micro and to main lab - both areas unable to see pending test; attempt to call cytology, but no cytology staff present on weekends.  Msg left for Carlena Hurl, RN for f/u on Monday.

## 2019-04-13 ENCOUNTER — Telehealth (HOSPITAL_COMMUNITY): Payer: Self-pay

## 2019-04-13 DIAGNOSIS — N7689 Other specified inflammation of vagina and vulva: Secondary | ICD-10-CM | POA: Diagnosis not present

## 2019-04-13 NOTE — Telephone Encounter (Signed)
Pt contacted and made aware of sample not being present in lab. Patient instructed to come back to office for recollection. Pt states she is unsure if she will come back or not. Denies any questions at this time.

## 2019-04-14 ENCOUNTER — Telehealth (HOSPITAL_COMMUNITY): Payer: Self-pay | Admitting: Emergency Medicine

## 2019-04-14 NOTE — Telephone Encounter (Signed)
Patient called back and spoke with Katherine Rodgers pt access. Pt again given information for returning to urgent care for recollection of sample at no charge. Pt already had patient engagement number given to her yesterday by [name]. Pt began verbally harassing Malikah over the phone.

## 2019-04-15 DIAGNOSIS — N3020 Other chronic cystitis without hematuria: Secondary | ICD-10-CM | POA: Diagnosis not present

## 2019-04-15 DIAGNOSIS — R350 Frequency of micturition: Secondary | ICD-10-CM | POA: Diagnosis not present

## 2019-04-15 DIAGNOSIS — Z20828 Contact with and (suspected) exposure to other viral communicable diseases: Secondary | ICD-10-CM | POA: Diagnosis not present

## 2019-04-15 DIAGNOSIS — R351 Nocturia: Secondary | ICD-10-CM | POA: Diagnosis not present

## 2019-04-29 ENCOUNTER — Encounter: Payer: Self-pay | Admitting: Certified Nurse Midwife

## 2019-04-29 ENCOUNTER — Ambulatory Visit (INDEPENDENT_AMBULATORY_CARE_PROVIDER_SITE_OTHER): Payer: BC Managed Care – PPO | Admitting: Certified Nurse Midwife

## 2019-04-29 ENCOUNTER — Other Ambulatory Visit: Payer: Self-pay

## 2019-04-29 VITALS — BP 114/76 | HR 91 | Ht 60.0 in | Wt 117.6 lb

## 2019-04-29 DIAGNOSIS — Z3041 Encounter for surveillance of contraceptive pills: Secondary | ICD-10-CM

## 2019-04-29 DIAGNOSIS — Z113 Encounter for screening for infections with a predominantly sexual mode of transmission: Secondary | ICD-10-CM | POA: Diagnosis not present

## 2019-04-29 DIAGNOSIS — R350 Frequency of micturition: Secondary | ICD-10-CM | POA: Diagnosis not present

## 2019-04-29 DIAGNOSIS — R351 Nocturia: Secondary | ICD-10-CM | POA: Diagnosis not present

## 2019-04-29 DIAGNOSIS — Z124 Encounter for screening for malignant neoplasm of cervix: Secondary | ICD-10-CM | POA: Diagnosis not present

## 2019-04-29 DIAGNOSIS — Z01419 Encounter for gynecological examination (general) (routine) without abnormal findings: Secondary | ICD-10-CM | POA: Diagnosis not present

## 2019-04-29 MED ORDER — NORETHIN-ETH ESTRADIOL-FE 0.8-25 MG-MCG PO CHEW: 1.0000 | CHEWABLE_TABLET | Freq: Every day | ORAL | 4 refills | Status: DC

## 2019-04-29 NOTE — Progress Notes (Signed)
GYNECOLOGY ANNUAL PREVENTATIVE CARE ENCOUNTER NOTE  History:     Katherine Rodgers is a 23 y.o. G0P0000 female here for a routine annual gynecologic exam.  Current complaints: none.   Denies abnormal vaginal bleeding, discharge, pelvic pain, problems with intercourse or other gynecologic concerns. Patient request pap smear yearly. Declines HIV, RPR screening    Gynecologic History Patient's last menstrual period was 02/24/2019 (exact date). Contraception: OCP (estrogen/progesterone) Last Pap: 04/2018. Results were: normal   Obstetric History OB History  Gravida Para Term Preterm AB Living  0 0 0 0 0 0  SAB TAB Ectopic Multiple Live Births  0 0 0 0 0    Past Medical History:  Diagnosis Date  . Eczema   . Migraine   . Urinary frequency     History reviewed. No pertinent surgical history.  Current Outpatient Medications on File Prior to Visit  Medication Sig Dispense Refill  . cyclobenzaprine (FLEXERIL) 10 MG tablet cyclobenzaprine 10 mg tablet    . Cetirizine HCl 10 MG CAPS Take 1 capsule (10 mg total) by mouth daily for 10 days. 10 capsule 0  . ibuprofen (ADVIL,MOTRIN) 800 MG tablet Take 1 tablet (800 mg total) by mouth every 8 (eight) hours as needed. (Patient not taking: Reported on 04/29/2019) 30 tablet 5  . levonorgestrel-ethinyl estradiol (KURVELO) 0.15-30 MG-MCG tablet Generess Fe 0.8 mg-25 mcg (24)/75 mg (4) chewable tablet    . metroNIDAZOLE (FLAGYL) 500 MG tablet Take 1 tablet (500 mg total) by mouth 2 (two) times daily. (Patient not taking: Reported on 04/29/2019) 14 tablet 0  . rizatriptan (MAXALT) 5 MG tablet Take 1 tablet (5 mg total) by mouth as needed for migraine. May repeat in 2 hours if needed (Patient not taking: Reported on 11/07/2018) 10 tablet 0   No current facility-administered medications on file prior to visit.     Allergies  Allergen Reactions  . Augmentin [Amoxicillin-Pot Clavulanate] Nausea And Vomiting    Social History:  reports that she has  never smoked. She has never used smokeless tobacco. She reports that she does not drink alcohol or use drugs.  Family History  Problem Relation Age of Onset  . Hypertension Mother     The following portions of the patient's history were reviewed and updated as appropriate: allergies, current medications, past family history, past medical history, past social history, past surgical history and problem list.  Review of Systems Pertinent items noted in HPI and remainder of comprehensive ROS otherwise negative.  Physical Exam:  BP 114/76   Pulse 91   Ht 5' (1.524 m)   Wt 117 lb 9.6 oz (53.3 kg)   LMP 02/24/2019 (Exact Date)   BMI 22.97 kg/m  CONSTITUTIONAL: Well-developed, well-nourished female in no acute distress.  HENT:  Normocephalic, atraumatic, External right and left ear normal. Oropharynx is clear and moist EYES: Conjunctivae and EOM are normal. Pupils are equal, round, and reactive to light. NECK: Normal range of motion, supple, no masses.  Normal thyroid.  SKIN: Skin is warm and dry. No rash noted. Not diaphoretic. No erythema. No pallor. MUSCULOSKELETAL: Normal range of motion. No tenderness.  No cyanosis, clubbing, or edema.  2+ distal pulses. NEUROLOGIC: Alert and oriented to person, place, and time. Normal reflexes, muscle tone coordination. No cranial nerve deficit noted. PSYCHIATRIC: Normal mood and affect. Normal behavior. Normal judgment and thought content. CARDIOVASCULAR: Normal heart rate noted, regular rhythm RESPIRATORY: Clear to auscultation bilaterally. Effort and breath sounds normal, no problems with respiration noted. BREASTS:  Symmetric in size. No masses, skin changes, nipple drainage, or lymphadenopathy. ABDOMEN: Soft, normal bowel sounds, no distention noted.  No tenderness, rebound or guarding.  PELVIC: Normal appearing external genitalia; normal appearing vaginal mucosa and cervix.  No abnormal discharge noted.  Pap smear obtained.  Normal uterine size, no  other palpable masses, no uterine or adnexal tenderness.    Assessment and Plan:    1. Women's annual routine gynecological examination - Normal well woman examination  - Pap smear, GC/C and trich screening obtained  - Cytology - PAP( [place])  2. Encounter for surveillance of contraceptive pills - Refill for 1 year sent to pharmacy of choice  - Norethindrone-Ethinyl Estradiol-Fe (GENERESS FE) 0.8-25 MG-MCG tablet; Chew 1 tablet by mouth daily.  Dispense: 3 Package; Refill: 4  Will follow up results of pap smear and manage accordingly. Routine preventative health maintenance measures emphasized. Please refer to After Visit Summary for other counseling recommendations.      Lajean Manes, [place] for [employer], [place]

## 2019-04-29 NOTE — Progress Notes (Signed)
Pt is here for annual exam. Last pap 04/18/2018, normal. Pt is on Generess FE for birth control, she needs a refill on this. Pt requests STD testing, swab only. Pt is seeing a urologist currently for abdominal pain she has been having.

## 2019-05-01 LAB — CYTOLOGY - PAP
Chlamydia: NEGATIVE
Diagnosis: NEGATIVE
Neisseria Gonorrhea: NEGATIVE
Trichomonas: NEGATIVE

## 2019-05-06 NOTE — Telephone Encounter (Signed)
Hello, Is there something else I need to do with this patient?

## 2019-05-07 DIAGNOSIS — R350 Frequency of micturition: Secondary | ICD-10-CM | POA: Diagnosis not present

## 2019-05-07 DIAGNOSIS — N3020 Other chronic cystitis without hematuria: Secondary | ICD-10-CM | POA: Diagnosis not present

## 2019-06-24 ENCOUNTER — Other Ambulatory Visit: Payer: Self-pay

## 2019-06-24 ENCOUNTER — Ambulatory Visit: Payer: BC Managed Care – PPO | Admitting: Nurse Practitioner

## 2019-06-24 ENCOUNTER — Encounter: Payer: Self-pay | Admitting: Nurse Practitioner

## 2019-06-24 VITALS — BP 115/71 | HR 80 | Wt 118.0 lb

## 2019-06-24 DIAGNOSIS — Z113 Encounter for screening for infections with a predominantly sexual mode of transmission: Secondary | ICD-10-CM

## 2019-06-24 DIAGNOSIS — Z3041 Encounter for surveillance of contraceptive pills: Secondary | ICD-10-CM | POA: Diagnosis not present

## 2019-06-24 DIAGNOSIS — N644 Mastodynia: Secondary | ICD-10-CM

## 2019-06-24 DIAGNOSIS — N898 Other specified noninflammatory disorders of vagina: Secondary | ICD-10-CM | POA: Diagnosis not present

## 2019-06-24 NOTE — Patient Instructions (Signed)

## 2019-06-24 NOTE — Progress Notes (Signed)
   GYNECOLOGY OFFICE VISIT NOTE   History:  23 y.o. G0P0000 here today for pain in right breast and increased vaginal discharge that she thinks may be BV.  Has recently seen a urologist and took antibiotics.  She has taken Flagyl in the last month but still have lots of vaginal discharge and is changing underwear several times a day.  States she has an inflamed bladder as diagnosed by urologist.  Will see Dr. Amalia Hailey for urology in Nov. 2020.Marland Kitchen  Has had pain in her right breast daily for one week but it resolved yesterday.  Pain was in the breast and would move to the nipple by the end of the day.  Client is somewhat frustrated with her breasts.  They are getting larger and she has to change bra sizes every couple of years.  Is hard to find reasonably priced bras that fit her well.  She denies any abnormal  bleeding, pelvic pain or other concerns. Currently on birth control pills and now still has cramping but periods are shorter from 5 days to 3 days.  Happy with current pill and has refills through August.  Past Medical History:  Diagnosis Date  . Eczema   . Migraine   . Urinary frequency     History reviewed. No pertinent surgical history.  The following portions of the patient's history were reviewed and updated as appropriate: allergies, current medications, past family history, past medical history, past social history, past surgical history and problem list.   Health Maintenance:  Normal pap on 04-29-19.    Review of Systems:  Pertinent items noted in HPI and remainder of comprehensive ROS otherwise negative.  Objective:  Physical Exam BP 115/71   Pulse 80   Wt 118 lb (53.5 kg)   LMP 05/12/2019 (Exact Date)   BMI 23.05 kg/m  CONSTITUTIONAL: Well-developed, well-nourished female in no acute distress.  HENT:  Normocephalic, atraumatic. External right and left ear normal.  EYES: Conjunctivae and EOM are normal. Pupils are equal, round.  No scleral icterus.  NECK: Normal range of  motion, supple SKIN: Skin is warm and dry. No rash noted. Not diaphoretic. No erythema. No pallor. Breast - Normal exam, no masses, no tenderness, no nipple discharge,  No rash, no rash under the breast, right breast slightly larger than left. NEUROLOGIC: Alert and oriented to person, place, and time. Normal muscle tone coordination. No cranial nerve deficit noted. PSYCHIATRIC: Normal mood and affect. Normal behavior. Normal judgment and thought content. ABDOMEN: Soft, no distention noted.   PELVIC: vaginal swab done.  Heavy discharge - white liquid noted over labia minora and at introitus. MUSCULOSKELETAL: Normal range of motion. No edema noted.  Labs and Imaging No results found.  Assessment & Plan:  1. Vaginal discharge Will wait on lab results.  Client has [place]. - Cervicovaginal ancillary only( [place])  2. Encounter for birth control pills maintenance Has pills and is taking daily.  3.  Breast pain No pain or discharge on exam.  Will continue to watch and return if pain is worsening.  Routine preventative health maintenance measures emphasized. Please refer to After Visit Summary for other counseling recommendations.   Return in about 11 months (around 05/24/2020).  Total face-to-face time with patient: 15 minutes.  Over 50% of encounter was spent on counseling and coordination of care.  Earlie Server, RN, MSN, NP-BC Nurse Practitioner, [facility] for [employer], [place] [facility] 06/24/2019 11:50 AM

## 2019-06-24 NOTE — Progress Notes (Signed)
RGYN pt presens for problem visit today pt   c/o : breast pain for the past few weeks.  Pt describes pain as being sharp.  Pt notes no sx's today.  Pt denies any lumps or knots or drainage or bleeding from breast.   Pt wants a vaginal swab. Possible BV

## 2019-07-02 LAB — CERVICOVAGINAL ANCILLARY ONLY
Bacterial Vaginitis (gardnerella): NEGATIVE
Candida Glabrata: NEGATIVE
Candida Vaginitis: NEGATIVE
Chlamydia: NEGATIVE
Comment: NEGATIVE
Comment: NEGATIVE
Comment: NEGATIVE
Comment: NEGATIVE
Comment: NEGATIVE
Comment: NORMAL
Neisseria Gonorrhea: NEGATIVE
Trichomonas: NEGATIVE

## 2019-07-16 DIAGNOSIS — R350 Frequency of micturition: Secondary | ICD-10-CM | POA: Diagnosis not present

## 2019-07-26 DIAGNOSIS — Z20828 Contact with and (suspected) exposure to other viral communicable diseases: Secondary | ICD-10-CM | POA: Diagnosis not present

## 2019-08-18 DIAGNOSIS — N760 Acute vaginitis: Secondary | ICD-10-CM | POA: Diagnosis not present

## 2019-08-20 ENCOUNTER — Ambulatory Visit: Payer: BC Managed Care – PPO

## 2019-09-01 DIAGNOSIS — Z20828 Contact with and (suspected) exposure to other viral communicable diseases: Secondary | ICD-10-CM | POA: Diagnosis not present

## 2019-09-24 DIAGNOSIS — R350 Frequency of micturition: Secondary | ICD-10-CM | POA: Diagnosis not present

## 2019-10-08 DIAGNOSIS — R350 Frequency of micturition: Secondary | ICD-10-CM | POA: Diagnosis not present

## 2019-10-08 DIAGNOSIS — N3281 Overactive bladder: Secondary | ICD-10-CM | POA: Diagnosis not present

## 2019-10-15 DIAGNOSIS — H6123 Impacted cerumen, bilateral: Secondary | ICD-10-CM | POA: Diagnosis not present

## 2019-11-02 ENCOUNTER — Other Ambulatory Visit: Payer: Self-pay

## 2019-11-02 ENCOUNTER — Telehealth: Payer: BC Managed Care – PPO

## 2019-11-02 DIAGNOSIS — N898 Other specified noninflammatory disorders of vagina: Secondary | ICD-10-CM

## 2019-11-02 MED ORDER — METRONIDAZOLE 500 MG PO TABS: 500.0000 mg | ORAL_TABLET | Freq: Two times a day (BID) | ORAL | 0 refills | Status: DC

## 2019-11-02 MED ORDER — FLUCONAZOLE 150 MG PO TABS: 150.0000 mg | ORAL_TABLET | Freq: Once | ORAL | 0 refills | Status: AC

## 2019-11-02 NOTE — Progress Notes (Signed)
Rx sent per protocol Pt made aware.    

## 2019-11-12 ENCOUNTER — Encounter: Payer: Self-pay | Admitting: Certified Nurse Midwife

## 2019-11-13 DIAGNOSIS — Z20828 Contact with and (suspected) exposure to other viral communicable diseases: Secondary | ICD-10-CM | POA: Diagnosis not present

## 2019-12-13 DIAGNOSIS — Z20828 Contact with and (suspected) exposure to other viral communicable diseases: Secondary | ICD-10-CM | POA: Diagnosis not present

## 2019-12-14 DIAGNOSIS — G43009 Migraine without aura, not intractable, without status migrainosus: Secondary | ICD-10-CM | POA: Diagnosis not present

## 2019-12-15 ENCOUNTER — Other Ambulatory Visit: Payer: Self-pay

## 2019-12-15 ENCOUNTER — Emergency Department (HOSPITAL_COMMUNITY)
Admission: EM | Admit: 2019-12-15 | Discharge: 2019-12-15 | Discharge status: Absent without leave | Payer: BC Managed Care – PPO

## 2019-12-21 DIAGNOSIS — G43009 Migraine without aura, not intractable, without status migrainosus: Secondary | ICD-10-CM | POA: Diagnosis not present

## 2020-01-21 ENCOUNTER — Other Ambulatory Visit: Payer: Self-pay | Admitting: Obstetrics

## 2020-01-21 DIAGNOSIS — B3731 Acute candidiasis of vulva and vagina: Secondary | ICD-10-CM

## 2020-01-21 DIAGNOSIS — B9689 Other specified bacterial agents as the cause of diseases classified elsewhere: Secondary | ICD-10-CM

## 2020-01-21 DIAGNOSIS — B373 Candidiasis of vulva and vagina: Secondary | ICD-10-CM

## 2020-01-21 MED ORDER — TINIDAZOLE 500 MG PO TABS: 1000.0000 mg | ORAL_TABLET | Freq: Every day | ORAL | 6 refills | Status: DC

## 2020-01-21 MED ORDER — FLUCONAZOLE 150 MG PO TABS: 150.0000 mg | ORAL_TABLET | Freq: Once | ORAL | 2 refills | Status: AC

## 2020-01-21 NOTE — Progress Notes (Unsigned)
Called patient to discuss problem with recurrent BV.  Chronic BV treatment was recommended because of the recurrent nature of this infection, and she agreed.  Tinidazole was Rx monthly for 6 months after the period.  She will follow up in the office in 6 months for a wet prep.  Brock Bad, MD 01/21/2020 12:28 PM

## 2020-01-22 ENCOUNTER — Emergency Department (HOSPITAL_BASED_OUTPATIENT_CLINIC_OR_DEPARTMENT_OTHER)
Admission: EM | Admit: 2020-01-22 | Discharge: 2020-01-23 | Disposition: A | Discharge status: Home / Self Care | Payer: BC Managed Care – PPO | Attending: Emergency Medicine | Admitting: Emergency Medicine

## 2020-01-22 ENCOUNTER — Ambulatory Visit: Payer: BC Managed Care – PPO

## 2020-01-22 ENCOUNTER — Encounter (HOSPITAL_BASED_OUTPATIENT_CLINIC_OR_DEPARTMENT_OTHER): Payer: Self-pay

## 2020-01-22 ENCOUNTER — Other Ambulatory Visit: Payer: Self-pay

## 2020-01-22 ENCOUNTER — Ambulatory Visit: Payer: BC Managed Care – PPO | Admitting: Obstetrics

## 2020-01-22 DIAGNOSIS — R1030 Lower abdominal pain, unspecified: Secondary | ICD-10-CM | POA: Diagnosis not present

## 2020-01-22 DIAGNOSIS — R102 Pelvic and perineal pain: Secondary | ICD-10-CM

## 2020-01-22 DIAGNOSIS — K5900 Constipation, unspecified: Secondary | ICD-10-CM | POA: Diagnosis not present

## 2020-01-22 DIAGNOSIS — R109 Unspecified abdominal pain: Secondary | ICD-10-CM | POA: Diagnosis not present

## 2020-01-22 DIAGNOSIS — Z79899 Other long term (current) drug therapy: Secondary | ICD-10-CM | POA: Diagnosis not present

## 2020-01-22 LAB — URINALYSIS, ROUTINE W REFLEX MICROSCOPIC
Bilirubin Urine: NEGATIVE
Glucose, UA: NEGATIVE mg/dL
Hgb urine dipstick: NEGATIVE
Ketones, ur: NEGATIVE mg/dL
Leukocytes,Ua: NEGATIVE
Nitrite: NEGATIVE
Protein, ur: NEGATIVE mg/dL
Specific Gravity, Urine: 1.025 (ref 1.005–1.030)
pH: 5.5 (ref 5.0–8.0)

## 2020-01-22 LAB — PREGNANCY, URINE: Preg Test, Ur: NEGATIVE

## 2020-01-22 NOTE — ED Triage Notes (Signed)
Pt c/o lower abd pain/pelvic pain started yesterday pm-LMP started in the am yesterday-NAD-steady gait

## 2020-01-22 NOTE — ED Provider Notes (Signed)
[facility] EMERGENCY DEPARTMENT Provider Note  CSN: [id number] Arrival date & time: 01/22/20 2109  Chief Complaint(s) Abdominal Pain  HPI Katherine Rodgers is a 24 y.o. female    Abdominal Pain Pain location:  Suprapubic Pain quality: sharp and stabbing   Pain radiates to:  Does not radiate Pain severity:  Moderate Onset quality:  Gradual Duration:  2 days Timing:  Constant Progression:  Worsening Relieved by:  Nothing Worsened by:  Position changes Associated symptoms: vaginal bleeding (started the menstrual cycle same day)   Associated symptoms: no chills, no constipation, no diarrhea, no fever, no nausea and no vomiting    Patient reports that she has recurrent bacterial vaginosis infections.  States that she discussed this with her OB who prescribed her Tindamax but instructed her not to take the medication until her menstrual cycle resolves.   Past Medical History Past Medical History:  Diagnosis Date  . Eczema   . Migraine   . Urinary frequency    Patient Active Problem List   Diagnosis Date Noted  . Acute non intractable tension-type headache 03/08/2017   Home Medication(s) Prior to Admission medications   Medication Sig Start Date End Date Taking? Authorizing Provider  cyclobenzaprine (FLEXERIL) 10 MG tablet cyclobenzaprine 10 mg tablet    [provider]  Norethindrone-Ethinyl Estradiol-Fe (GENERESS FE) 0.8-25 MG-MCG tablet Chew 1 tablet by mouth daily. 04/29/19   Sharyon Cable, CNM  polyethylene glycol powder (MIRALAX) 17 GM/SCOOP powder Please take 6 capfuls of MiraLAX in a 32 oz bottle of Gatorade over 2-4 hour period. The following day take 3 capfuls. On day 3 start taking 1 capful 3 times a day. Slowly cut back as needed until you have normal bowel movements. 01/23/20   Nira Conn, MD  rizatriptan (MAXALT) 5 MG tablet Take 1 tablet (5 mg total) by mouth as needed for migraine. May repeat in 2 hours if needed 03/07/17    Shon Hale, MD  tinidazole [facility]) 500 MG tablet Take 2 tablets (1,000 mg total) by mouth daily with breakfast. 01/21/20   Brock Bad, MD                                                                                                                                    Past Surgical History History reviewed. No pertinent surgical history. Family History Family History  Problem Relation Age of Onset  . Hypertension Mother     Social History Social History   Tobacco Use  . Smoking status: Never Smoker  . Smokeless tobacco: Never Used  Substance Use Topics  . Alcohol use: Yes    Alcohol/week: 0.0 standard drinks    Comment: occ  . Drug use: No   Allergies Amoxicillin-pot clavulanate  Review of Systems Review of Systems  Constitutional: Negative for chills and fever.  Gastrointestinal: Positive for abdominal pain. Negative for constipation, diarrhea, nausea and vomiting.  Genitourinary: Positive  for vaginal bleeding (started the menstrual cycle same day).   All other systems are reviewed and are negative for acute change except as noted in the HPI  Physical Exam Vital Signs  I have reviewed the triage vital signs BP (!) 122/94 (BP Location: Right Arm)   Pulse 87   Temp 99 F (37.2 C) (Oral)   Resp 15   LMP 01/21/2020   SpO2 100%   Physical Exam Vitals reviewed.  Constitutional:      General: She is not in acute distress.    Appearance: She is well-developed. She is not diaphoretic.  HENT:     Head: Normocephalic and atraumatic.     Right Ear: External ear normal.     Left Ear: External ear normal.     Nose: Nose normal.  Eyes:     General: No scleral icterus.    Conjunctiva/sclera: Conjunctivae normal.  Neck:     Trachea: Phonation normal.  Cardiovascular:     Rate and Rhythm: Normal rate and regular rhythm.  Pulmonary:     Effort: Pulmonary effort is normal. No respiratory distress.     Breath sounds: No stridor.  Abdominal:      General: There is no distension.     Tenderness: There is abdominal tenderness in the suprapubic area.  Musculoskeletal:        General: Normal range of motion.     Cervical back: Normal range of motion.  Neurological:     Mental Status: She is alert and oriented to person, place, and time.  Psychiatric:        Behavior: Behavior normal.     ED Results and Treatments Labs (all labs ordered are listed, but only abnormal results are displayed) Labs Reviewed  CBC WITH DIFFERENTIAL/PLATELET - Abnormal; Notable for the following components:      Result Value   WBC 11.0 (*)    RBC 5.59 (*)    Hemoglobin 16.4 (*)    HCT 47.2 (*)    All other components within normal limits  COMPREHENSIVE METABOLIC PANEL - Abnormal; Notable for the following components:   Total Protein 8.9 (*)    All other components within normal limits  URINALYSIS, ROUTINE W REFLEX MICROSCOPIC  PREGNANCY, URINE                                                                                                                         EKG  EKG Interpretation  Date/Time:    Ventricular Rate:    PR Interval:    QRS Duration:   QT Interval:    QTC Calculation:   R Axis:     Text Interpretation:        Radiology CT ABDOMEN PELVIS W CONTRAST  Result Date: 01/23/2020 CLINICAL DATA:  Appendicitis suspected, lower abdominal and pelvic pain which began yesterday morning tree EXAM: CT ABDOMEN AND PELVIS WITH CONTRAST TECHNIQUE: Multidetector CT imaging of the abdomen and pelvis was performed using the standard protocol following  bolus administration of intravenous contrast. CONTRAST:  165mL OMNIPAQUE IOHEXOL 300 MG/ML  SOLN COMPARISON:  Pelvic ultrasound 03/18/2019 FINDINGS: Lower chest: Lung bases are clear. Normal heart size. No pericardial effusion. Hepatobiliary: No focal liver abnormality is seen. No gallstones, gallbladder wall thickening, or biliary dilatation. Pancreas: Unremarkable. No pancreatic ductal dilatation or  surrounding inflammatory changes. Spleen: Normal in size without focal abnormality. Adrenals/Urinary Tract: Normal adrenal glands. Kidneys are normally located and enhance symmetrically. No concerning renal lesion, urolithiasis, or hydronephrosis. Urinary bladder is unremarkable. Stomach/Bowel: Distal esophagus, stomach and duodenal sweep are unremarkable. No small bowel wall thickening or dilatation. No evidence of obstruction. A normal appearing appendix is seen coursing medially from the cecal tip terminating in the right lower quadrant best seen on coronal images 22-34. No focal periappendiceal stranding or fluid. Moderate to large colonic stool burden with slight redundancy of the transverse colon. No colonic dilatation or wall thickening. Vascular/Lymphatic: No significant vascular findings are present. No enlarged abdominal or pelvic lymph nodes though evaluation may be limited by a paucity of intraperitoneal fat. Reproductive: Radiolucent tampon within the vaginal canal. Anteverted uterus. No concerning adnexal lesions. Other: Trace free fluid in the deep pelvis, difficult to fully assess the attenuation given the small volume. No abdominopelvic free air. No bowel containing hernia. Musculoskeletal: No acute or significant osseous findings. IMPRESSION: 1. No CT evidence of acute appendicitis. 2. Moderate to large colonic stool burden, correlate with constipation. 3. Trace free fluid in the deep pelvis, likely within physiologic normal for this reproductive age female. Electronically Signed   By: Lovena Le M.D.   On: 01/23/2020 03:57    Pertinent labs & imaging results that were available during my care of the patient were reviewed by me and considered in my medical decision making (see chart for details).  Medications Ordered in ED Medications  morphine 4 MG/ML injection 4 mg (4 mg Intravenous Given 01/23/20 0043)  iohexol (OMNIPAQUE) 300 MG/ML solution 100 mL (100 mLs Intravenous Contrast Given  01/23/20 0309)                                                                                                                                    Procedures Procedures  (including critical care time)  Medical Decision Making / ED Course I have reviewed the nursing notes for this encounter and the patient's prior records (if available in EHR or on provided paperwork).   Veatrice Eckstein was evaluated in Emergency Department on 01/23/2020 for the symptoms described in the history of present illness. She was evaluated in the context of the global COVID-19 pandemic, which necessitated consideration that the patient might be at risk for infection with the SARS-CoV-2 virus that causes COVID-19. Institutional protocols and algorithms that pertain to the evaluation of patients at risk for COVID-19 are in a state of rapid change based on information released by regulatory bodies including the CDC and federal and state organizations. These  policies and algorithms were followed during the patient's care in the ED.  Work-up reassuring without evidence of appendicitis or serious intra-abdominal inflammatory/infectious process on CT scan.  Patient was noted to have moderate to large colonic stool burden likely contributing to her symptoms.  Rest of the work-up reassuring without urinary tract infection.  UPT negative.      Final Clinical Impression(s) / ED Diagnoses Final diagnoses:  Suprapubic pain  Constipation, unspecified constipation type    The patient appears reasonably screened and/or stabilized for discharge and I doubt any other medical condition or other [facility] requiring further screening, evaluation, or treatment in the ED at this time prior to discharge. Safe for discharge with strict return precautions.  Disposition: Discharge  Condition: Good  I have discussed the results, Dx and Tx plan with the patient/family who expressed understanding and agree(s) with the plan. Discharge instructions  discussed at length. The patient/family was given strict return precautions who verbalized understanding of the instructions. No further questions at time of discharge.    ED Discharge Orders         Ordered    polyethylene glycol powder (MIRALAX) 17 GM/SCOOP powder     01/23/20 0501           Follow Up: Primary care provider  Schedule an appointment as soon as possible for a visit      This chart was dictated using voice recognition software.  Despite best efforts to proofread,  errors can occur which can change the documentation meaning.   Nira Conn, MD 01/23/20 [phone]

## 2020-01-23 ENCOUNTER — Encounter (HOSPITAL_BASED_OUTPATIENT_CLINIC_OR_DEPARTMENT_OTHER): Payer: Self-pay

## 2020-01-23 ENCOUNTER — Emergency Department (HOSPITAL_BASED_OUTPATIENT_CLINIC_OR_DEPARTMENT_OTHER): Payer: BC Managed Care – PPO

## 2020-01-23 DIAGNOSIS — R102 Pelvic and perineal pain: Secondary | ICD-10-CM | POA: Diagnosis not present

## 2020-01-23 DIAGNOSIS — K5900 Constipation, unspecified: Secondary | ICD-10-CM | POA: Diagnosis not present

## 2020-01-23 DIAGNOSIS — R1030 Lower abdominal pain, unspecified: Secondary | ICD-10-CM | POA: Diagnosis not present

## 2020-01-23 LAB — CBC WITH DIFFERENTIAL/PLATELET
Abs Immature Granulocytes: 0.04 10*3/uL (ref 0.00–0.07)
Basophils Absolute: 0 10*3/uL (ref 0.0–0.1)
Basophils Relative: 0 %
Eosinophils Absolute: 0.2 10*3/uL (ref 0.0–0.5)
Eosinophils Relative: 2 %
HCT: 47.2 % — ABNORMAL HIGH (ref 36.0–46.0)
Hemoglobin: 16.4 g/dL — ABNORMAL HIGH (ref 12.0–15.0)
Immature Granulocytes: 0 %
Lymphocytes Relative: 25 %
Lymphs Abs: 2.7 10*3/uL (ref 0.7–4.0)
MCH: 29.3 pg (ref 26.0–34.0)
MCHC: 34.7 g/dL (ref 30.0–36.0)
MCV: 84.4 fL (ref 80.0–100.0)
Monocytes Absolute: 0.4 10*3/uL (ref 0.1–1.0)
Monocytes Relative: 4 %
Neutro Abs: 7.6 10*3/uL (ref 1.7–7.7)
Neutrophils Relative %: 69 %
Platelets: 342 10*3/uL (ref 150–400)
RBC: 5.59 MIL/uL — ABNORMAL HIGH (ref 3.87–5.11)
RDW: 12.8 % (ref 11.5–15.5)
WBC: 11 10*3/uL — ABNORMAL HIGH (ref 4.0–10.5)
nRBC: 0 % (ref 0.0–0.2)

## 2020-01-23 LAB — COMPREHENSIVE METABOLIC PANEL
ALT: 19 U/L (ref 0–44)
AST: 24 U/L (ref 15–41)
Albumin: 4.2 g/dL (ref 3.5–5.0)
Alkaline Phosphatase: 56 U/L (ref 38–126)
Anion gap: 12 (ref 5–15)
BUN: 8 mg/dL (ref 6–20)
CO2: 24 mmol/L (ref 22–32)
Calcium: 9.1 mg/dL (ref 8.9–10.3)
Chloride: 101 mmol/L (ref 98–111)
Creatinine, Ser: 0.75 mg/dL (ref 0.44–1.00)
GFR calc Af Amer: >60 mL/min (ref >60)
GFR calc non Af Amer: >60 mL/min (ref >60)
Glucose, Bld: 78 mg/dL (ref 70–99)
Potassium: 3.6 mmol/L (ref 3.5–5.1)
Sodium: 137 mmol/L (ref 135–145)
Total Bilirubin: 0.6 mg/dL (ref 0.3–1.2)
Total Protein: 8.9 g/dL — ABNORMAL HIGH (ref 6.5–8.1)

## 2020-01-23 MED ORDER — POLYETHYLENE GLYCOL 3350 17 GM/SCOOP PO POWD: ORAL | 0 refills | Status: DC

## 2020-01-23 MED ORDER — KETOROLAC TROMETHAMINE 60 MG/2ML IM SOLN: 30.0000 mg | Freq: Once | INTRAMUSCULAR | Status: DC

## 2020-01-23 MED ORDER — MORPHINE SULFATE (PF) 4 MG/ML IV SOLN
4.0000 mg | Freq: Once | INTRAVENOUS | Status: AC
  Administered 2020-01-23: 4 mg via INTRAVENOUS
  Filled 2020-01-23: qty 1

## 2020-01-23 MED ORDER — IOHEXOL 300 MG/ML  SOLN
100.0000 mL | Freq: Once | INTRAMUSCULAR | Status: AC | PRN
  Administered 2020-01-23: 100 mL via INTRAVENOUS

## 2020-01-23 NOTE — Discharge Instructions (Addendum)

## 2020-02-09 ENCOUNTER — Encounter: Payer: Self-pay | Admitting: Neurology

## 2020-02-12 ENCOUNTER — Ambulatory Visit: Payer: BC Managed Care – PPO | Admitting: Neurology

## 2020-02-22 DIAGNOSIS — S8002XA Contusion of left knee, initial encounter: Secondary | ICD-10-CM | POA: Diagnosis not present

## 2020-02-22 DIAGNOSIS — S2002XA Contusion of left breast, initial encounter: Secondary | ICD-10-CM | POA: Diagnosis not present

## 2020-02-22 DIAGNOSIS — W182XXA Fall in (into) shower or empty bathtub, initial encounter: Secondary | ICD-10-CM | POA: Diagnosis not present

## 2020-02-24 NOTE — Progress Notes (Signed)
NEUROLOGY CONSULTATION NOTE  Katherine Rodgers MRN: [id number] DOB: 1995/11/20  Referring provider: Meridee Score. Katherine Marshall, MD Primary care provider: No PCP  Reason for consult:  migraines  HISTORY OF PRESENT ILLNESS: Katherine Rodgers is a 24 year old right-handed female who presents for worsening migraines.  History supplemented by referring provider's note.  She has history of migraines since childhood after hitting her head at age 31.  They are severe pounding pain on top of head and associated with photophobia, phonophobia, nausea.  They last 2 hours with Maxalt.  They had been infrequent.   She had a severe and different headache in April.  She thinks it may have been triggered after getting a new eyeglass prescription which was too strong.  It was a severe squeezing and stabbing pain in the back of her head.  No neck pain.  She had lightheadedness and phonophobia.  No nausea, vomiting, photophobia.  Walking would make it worse.  Nothing made it better.  Her Maxalt, which usually works, was ineffective.  Tylenol and Flexeril were ineffective.  She went to Urgent Care but they didn't do anything for her.  The headache lasted for 2 weeks and spontaneously resolved.  Current NSAIDS:  none Current analgesics:  none Current triptans:  Rizatriptan 5mg  (effective but unable to take it during the day due to causing drowsiness) Current ergotamine:  none Current anti-emetic:  none Current muscle relaxants:  Cyclobenzaprine 10mg  QHS PRN Current anti-anxiolytic:  none Current sleep aide:  none Current Antihypertensive medications:  none Current Antidepressant medications:  none Current Anticonvulsant medications:  none Current anti-CGRP:  none Current Vitamins/Herbal/Supplements:  none Current Antihistamines/Decongestants:  none Other therapy:  none Hormone/birth control:  Generess Fe Other medications:  none  Past NSAIDS:  ibuprofen Past analgesics:  Tylenol Past abortive triptans:   Sumatriptan NS Past abortive ergotamine:  none Past muscle relaxants:  none Past anti-emetic:  none Past antihypertensive medications:  none Past antidepressant medications:  none Past anticonvulsant medications:  none Past anti-CGRP:  none Past vitamins/Herbal/Supplements:  none Past antihistamines/decongestants:  none  Caffeine:  No caffeine Diet:  4 glasses of water daily.   Exercise:  Not routine Depression:  A little bit; Anxiety:  no Other pain:  Back pain Sleep:  6 hours a night Family history of headache:  no  12/21/2019 LABS:  CBC with WBC 6.7, HGB 14.7, HCT 42.5, PLT 368; CMP with Na 136, K 4.5, Cl 102, CO2 27, glucose 60, BUN 7, Cr 0.77, t bili 0.3, ALP 41, AST 23, ALT 27; TSH 1.41   PAST MEDICAL HISTORY: Past Medical History:  Diagnosis Date  . Eczema   . Migraine   . Urinary frequency     PAST SURGICAL HISTORY: No past surgical history on file.  MEDICATIONS: Current Outpatient Medications on File Prior to Visit  Medication Sig Dispense Refill  . cyclobenzaprine (FLEXERIL) 10 MG tablet cyclobenzaprine 10 mg tablet    . Norethindrone-Ethinyl Estradiol-Fe (GENERESS FE) 0.8-25 MG-MCG tablet Chew 1 tablet by mouth daily. 3 Package 4  . polyethylene glycol powder (MIRALAX) 17 GM/SCOOP powder Please take 6 capfuls of MiraLAX in a 32 oz bottle of Gatorade over 2-4 hour period. The following day take 3 capfuls. On day 3 start taking 1 capful 3 times a day. Slowly cut back as needed until you have normal bowel movements. 255 g 0  . rizatriptan (MAXALT) 5 MG tablet Take 1 tablet (5 mg total) by mouth as needed for migraine. May repeat in 2  hours if needed 10 tablet 0  . tinidazole (TINDAMAX) 500 MG tablet Take 2 tablets (1,000 mg total) by mouth daily with breakfast. 10 tablet 6   No current facility-administered medications on file prior to visit.    ALLERGIES: Allergies  Allergen Reactions  . Amoxicillin-Pot Clavulanate Nausea And Vomiting and Nausea Only     FAMILY HISTORY: Family History  Problem Relation Age of Onset  . Hypertension Mother     SOCIAL HISTORY: Social History   Socioeconomic History  . Marital status: Single    Spouse name: Not on file  . Number of children: Not on file  . Years of education: Not on file  . Highest education level: Not on file  Occupational History  . Not on file  Tobacco Use  . Smoking status: Never Smoker  . Smokeless tobacco: Never Used  Vaping Use  . Vaping Use: Never used  Substance and Sexual Activity  . Alcohol use: Yes    Alcohol/week: 0.0 standard drinks    Comment: occ  . Drug use: No  . Sexual activity: Not on file  Other Topics Concern  . Not on file  Social History Narrative  . Not on file   Social Determinants of Health   Financial Resource Strain:   . Difficulty of Paying Living Expenses:   Food Insecurity:   . Worried About Programme researcher, broadcasting/film/video in the Last Year:   . Barista in the Last Year:   Transportation Needs:   . Freight forwarder (Medical):   Marland Kitchen Lack of Transportation (Non-Medical):   Physical Activity:   . Days of Exercise per Week:   . Minutes of Exercise per Session:   Stress:   . Feeling of Stress :   Social Connections:   . Frequency of Communication with Friends and Family:   . Frequency of Social Gatherings with Friends and Family:   . Attends Religious Services:   . Active Member of Clubs or Organizations:   . Attends Banker Meetings:   Marland Kitchen Marital Status:   Intimate Partner Violence:   . Fear of Current or Ex-Partner:   . Emotionally Abused:   Marland Kitchen Physically Abused:   . Sexually Abused:     PHYSICAL EXAM: Blood pressure 125/87, pulse 86, height 5' (1.524 m), weight 123 lb 9.6 oz (56.1 kg), SpO2 100 %. General: No acute distress.  Patient appears well-groomed.  Head:  Normocephalic/atraumatic Eyes:  fundi examined but not visualized Neck: supple, no paraspinal tenderness, full range of motion Back: No paraspinal  tenderness Heart: regular rate and rhythm Lungs: Clear to auscultation bilaterally. Vascular: No carotid bruits. Neurological Exam: Mental status: alert and oriented to person, place, and time, recent and remote memory intact, fund of knowledge intact, attention and concentration intact, speech fluent and not dysarthric, language intact. Cranial nerves: CN I: not tested CN II: pupils equal, round and reactive to light, visual fields intact CN III, IV, VI:  full range of motion, no nystagmus, no ptosis CN V: facial sensation intact CN VII: upper and lower face symmetric CN VIII: hearing intact CN IX, X: gag intact, uvula midline CN XI: sternocleidomastoid and trapezius muscles intact CN XII: tongue midline Bulk & Tone: normal, no fasciculations. Motor:  5/5 throughout  Sensation:  Pinprick and vibration sensation intact. Deep Tendon Reflexes:  2+ throughout, toes downgoing.  Finger to nose testing:  Without dysmetria.  Heel to shin:  Without dysmetria.  Gait:  Normal station and stride.  Able to turn and tandem walk. Romberg negative.  IMPRESSION: Status migrainosus, not intractable Migraine without aura, without status migrainosus, not intractable  PLAN: She may not have a recurrence of such severe intractable migraines, so would defer a preventative medication for now.  Since she is unable to tolerate triptans during the day due to drowsiness, I will give her samples of Ubrelvy to try.  She is going to be moving to [place] next month.  I advised her to quickly establish care with a PCP and neurologist.   Thank you for allowing me to take part in the care of this patient.  Metta Clines, DO  CC:  Anastasia Pall. Lindell Noe, MD

## 2020-02-25 ENCOUNTER — Encounter: Payer: Self-pay | Admitting: Neurology

## 2020-02-25 ENCOUNTER — Other Ambulatory Visit: Payer: Self-pay

## 2020-02-25 ENCOUNTER — Ambulatory Visit: Payer: BC Managed Care – PPO | Admitting: Neurology

## 2020-02-25 VITALS — BP 125/87 | HR 86 | Ht 60.0 in | Wt 123.6 lb

## 2020-02-25 DIAGNOSIS — G43011 Migraine without aura, intractable, with status migrainosus: Secondary | ICD-10-CM | POA: Diagnosis not present

## 2020-02-25 NOTE — Patient Instructions (Signed)
1.  Next time you have a migraine, try Ubrelvy 100mg .  Take 1 tablet earliest onset of migraine.  May repeat dose in 2 hours if needed (maximum 2 tablets in 24 hours) 2.  Establish care with a PCP and neurologist once you move to .

## 2020-03-11 DIAGNOSIS — Z20822 Contact with and (suspected) exposure to covid-19: Secondary | ICD-10-CM | POA: Diagnosis not present

## 2020-03-15 DIAGNOSIS — R05 Cough: Secondary | ICD-10-CM | POA: Diagnosis not present

## 2020-03-16 ENCOUNTER — Ambulatory Visit (INDEPENDENT_AMBULATORY_CARE_PROVIDER_SITE_OTHER): Payer: BC Managed Care – PPO | Admitting: Obstetrics

## 2020-03-16 ENCOUNTER — Other Ambulatory Visit: Payer: Self-pay

## 2020-03-16 ENCOUNTER — Other Ambulatory Visit (HOSPITAL_COMMUNITY)
Admission: RE | Admit: 2020-03-16 | Discharge: 2020-03-16 | Disposition: A | Discharge status: Home / Self Care | Payer: BC Managed Care – PPO | Source: Ambulatory Visit | Attending: Obstetrics | Admitting: Obstetrics

## 2020-03-16 ENCOUNTER — Encounter: Payer: Self-pay | Admitting: Obstetrics

## 2020-03-16 VITALS — BP 127/78 | HR 90 | Ht 60.0 in | Wt 125.0 lb

## 2020-03-16 DIAGNOSIS — B9689 Other specified bacterial agents as the cause of diseases classified elsewhere: Secondary | ICD-10-CM | POA: Insufficient documentation

## 2020-03-16 DIAGNOSIS — Z113 Encounter for screening for infections with a predominantly sexual mode of transmission: Secondary | ICD-10-CM | POA: Insufficient documentation

## 2020-03-16 DIAGNOSIS — N760 Acute vaginitis: Secondary | ICD-10-CM | POA: Insufficient documentation

## 2020-03-16 DIAGNOSIS — Z01419 Encounter for gynecological examination (general) (routine) without abnormal findings: Secondary | ICD-10-CM | POA: Insufficient documentation

## 2020-03-16 DIAGNOSIS — N898 Other specified noninflammatory disorders of vagina: Secondary | ICD-10-CM

## 2020-03-16 DIAGNOSIS — Z3041 Encounter for surveillance of contraceptive pills: Secondary | ICD-10-CM

## 2020-03-16 MED ORDER — METRONIDAZOLE 500 MG PO TABS: 500.0000 mg | ORAL_TABLET | Freq: Two times a day (BID) | ORAL | 5 refills | Status: DC

## 2020-03-16 MED ORDER — NORETHIN-ETH ESTRADIOL-FE 0.8-25 MG-MCG PO CHEW: 1.0000 | CHEWABLE_TABLET | Freq: Every day | ORAL | 3 refills | Status: AC

## 2020-03-16 NOTE — Progress Notes (Signed)
Subjective:        Katherine Rodgers is a 24 y.o. female here for a routine exam.  Current complaints: Recurrent BV.    Personal health questionnaire:  Is patient Ashkenazi Jewish, have a family history of breast and/or ovarian cancer: no Is there a family history of uterine cancer diagnosed at age < 16, gastrointestinal cancer, urinary tract cancer, family member who is a Personnel officer syndrome-associated carrier: no Is the patient overweight and hypertensive, family history of diabetes, personal history of gestational diabetes, preeclampsia or PCOS: no Is patient over 67, have PCOS,  family history of premature CHD under age 57, diabetes, smoke, have hypertension or peripheral artery disease:  no At any time, has a partner hit, kicked or otherwise hurt or frightened you?: no Over the past 2 weeks, have you felt down, depressed or hopeless?: no Over the past 2 weeks, have you felt little interest or pleasure in doing things?:no   Gynecologic History Patient's last menstrual period was 02/18/2020. Contraception: OCP (estrogen/progesterone) Last Pap: 04-29-2019. Results were: normal Last mammogram: n/a. Results were: n/a  Obstetric History OB History  Gravida Para Term Preterm AB Living  0 0 0 0 0 0  SAB TAB Ectopic Multiple Live Births  0 0 0 0 0    Past Medical History:  Diagnosis Date  . Eczema   . Migraine   . Urinary frequency     History reviewed. No pertinent surgical history.   Current Outpatient Medications:  .  cyclobenzaprine (FLEXERIL) 10 MG tablet, cyclobenzaprine 10 mg tablet, Disp: , Rfl:  .  Oxybutynin Chloride 10 % GEL, Apply 1 packet daily, Disp: , Rfl:  .  rizatriptan (MAXALT) 5 MG tablet, Take 1 tablet (5 mg total) by mouth as needed for migraine. May repeat in 2 hours if needed, Disp: 10 tablet, Rfl: 0 .  traMADol (ULTRAM) 50 MG tablet, Take 50 mg by mouth every 6 (six) hours as needed., Disp: , Rfl:  .  metroNIDAZOLE (FLAGYL) 500 MG tablet, Take 1 tablet (500  mg total) by mouth 2 (two) times daily., Disp: 14 tablet, Rfl: 5 .  Norethindrone-Ethinyl Estradiol-Fe (GENERESS FE) 0.8-25 MG-MCG tablet, Chew 1 tablet by mouth daily., Disp: 84 tablet, Rfl: 3 Allergies  Allergen Reactions  . Augmentin [Amoxicillin-Pot Clavulanate] Nausea And Vomiting and Nausea Only    Social History   Tobacco Use  . Smoking status: Never Smoker  . Smokeless tobacco: Never Used  Substance Use Topics  . Alcohol use: Yes    Alcohol/week: 0.0 standard drinks    Comment: occ    Family History  Problem Relation Age of Onset  . Hypertension Mother       Review of Systems  Constitutional: negative for fatigue and weight loss Respiratory: negative for cough and wheezing Cardiovascular: negative for chest pain, fatigue and palpitations Gastrointestinal: negative for abdominal pain and change in bowel habits Musculoskeletal:negative for myalgias Neurological: negative for gait problems and tremors Behavioral/Psych: negative for abusive relationship, depression Endocrine: negative for temperature intolerance    Genitourinary:negative for abnormal menstrual periods, genital lesions, hot flashes, sexual problems and vaginal discharge Integument/breast: negative for breast lump, breast tenderness, nipple discharge and skin lesion(s)    Objective:       BP 127/78   Pulse 90   Ht 5' (1.524 m)   Wt 125 lb (56.7 kg)   LMP 02/18/2020   BMI 24.41 kg/m  General:   alert  Skin:   no rash or abnormalities  Lungs:  clear to auscultation bilaterally  Heart:   regular rate and rhythm, S1, S2 normal, no murmur, click, rub or gallop  Breasts:   normal without suspicious masses, skin or nipple changes or axillary nodes  Abdomen:  normal findings: no organomegaly, soft, non-tender and no hernia  Pelvis:  External genitalia: normal general appearance Urinary system: urethral meatus normal and bladder without fullness, nontender Vaginal: normal without tenderness, induration  or masses Cervix: normal appearance Adnexa: normal bimanual exam Uterus: anteverted and non-tender, normal size   Lab Review Urine pregnancy test Labs reviewed yes Radiologic studies reviewed no  50% of 20 min visit spent on counseling and coordination of care.   Assessment:     1. Encounter for routine gynecological examination with Papanicolaou smear of cervix Rx: - Cytology - PAP( [facility])  2. Vaginal discharge Rx: - Cervicovaginal ancillary only( [facility])  3. Screen for STD (sexually transmitted disease) Rx: - Hepatitis B surface antigen - Hepatitis C antibody - HIV Antibody (routine testing w rflx) - [facility]  4. Encounter for surveillance of contraceptive pills Rx: - Norethindrone-Ethinyl Estradiol-Fe (GENERESS FE) 0.8-25 MG-MCG tablet; Chew 1 tablet by mouth daily.  Dispense: 84 tablet; Refill: 3  5. BV (bacterial vaginosis) Rx: - metroNIDAZOLE (FLAGYL) 500 MG tablet; Take 1 tablet (500 mg total) by mouth 2 (two) times daily.  Dispense: 14 tablet; Refill: 5    Plan:    Education reviewed: calcium supplements, depression evaluation, low fat, low cholesterol diet, safe sex/STD prevention, self breast exams and weight bearing exercise. Contraception: OCP (estrogen/progesterone). Follow up in: 1 year.   Meds ordered this encounter  Medications  . Norethindrone-Ethinyl Estradiol-Fe (GENERESS FE) 0.8-25 MG-MCG tablet    Sig: Chew 1 tablet by mouth daily.    Dispense:  84 tablet    Refill:  3  . metroNIDAZOLE (FLAGYL) 500 MG tablet    Sig: Take 1 tablet (500 mg total) by mouth 2 (two) times daily.    Dispense:  14 tablet    Refill:  5   Orders Placed This Encounter  Procedures  . Hepatitis B surface antigen  . Hepatitis C antibody  . HIV Antibody (routine testing w rflx)  . RPR    Brock Bad, MD 03/16/2020 1:54 PM

## 2020-03-16 NOTE — Progress Notes (Signed)
Patient presents for AEX. Patient has no concerns.  Last pap: 04/29/19 Normal

## 2020-03-17 LAB — CERVICOVAGINAL ANCILLARY ONLY
Bacterial Vaginitis (gardnerella): NEGATIVE
Candida Glabrata: NEGATIVE
Candida Vaginitis: POSITIVE — AB
Chlamydia: NEGATIVE
Comment: NEGATIVE
Comment: NEGATIVE
Comment: NEGATIVE
Comment: NEGATIVE
Comment: NEGATIVE
Comment: NORMAL
Neisseria Gonorrhea: NEGATIVE
Trichomonas: NEGATIVE

## 2020-03-17 LAB — HEPATITIS B SURFACE ANTIGEN: Hepatitis B Surface Ag: NEGATIVE

## 2020-03-17 LAB — HIV ANTIBODY (ROUTINE TESTING W REFLEX): HIV Screen 4th Generation wRfx: NONREACTIVE

## 2020-03-17 LAB — RPR: RPR Ser Ql: NONREACTIVE

## 2020-03-17 LAB — HEPATITIS C ANTIBODY: Hep C Virus Ab: <0.1 s/co ratio (ref 0.0–0.9)

## 2020-03-18 ENCOUNTER — Other Ambulatory Visit: Payer: Self-pay | Admitting: Obstetrics

## 2020-03-18 DIAGNOSIS — B3731 Acute candidiasis of vulva and vagina: Secondary | ICD-10-CM

## 2020-03-18 DIAGNOSIS — B9689 Other specified bacterial agents as the cause of diseases classified elsewhere: Secondary | ICD-10-CM

## 2020-03-18 DIAGNOSIS — B373 Candidiasis of vulva and vagina: Secondary | ICD-10-CM

## 2020-03-18 DIAGNOSIS — N760 Acute vaginitis: Secondary | ICD-10-CM

## 2020-03-18 LAB — CYTOLOGY - PAP: Diagnosis: NEGATIVE

## 2020-03-18 MED ORDER — FLUCONAZOLE 150 MG PO TABS: 150.0000 mg | ORAL_TABLET | Freq: Once | ORAL | 0 refills | Status: AC

## 2020-03-18 MED ORDER — TINIDAZOLE 500 MG PO TABS: 1000.0000 mg | ORAL_TABLET | Freq: Every day | ORAL | 2 refills | Status: AC

## 2020-03-25 ENCOUNTER — Ambulatory Visit
Admission: RE | Admit: 2020-03-25 | Discharge: 2020-03-25 | Disposition: A | Discharge status: Home / Self Care | Payer: BC Managed Care – PPO | Source: Ambulatory Visit | Attending: Family Medicine | Admitting: Family Medicine

## 2020-03-25 ENCOUNTER — Other Ambulatory Visit: Payer: Self-pay | Admitting: Family Medicine

## 2020-03-25 DIAGNOSIS — R05 Cough: Secondary | ICD-10-CM | POA: Diagnosis not present

## 2020-03-25 DIAGNOSIS — R059 Cough, unspecified: Secondary | ICD-10-CM

## 2020-03-31 DIAGNOSIS — R05 Cough: Secondary | ICD-10-CM | POA: Diagnosis not present

## 2020-04-06 DIAGNOSIS — B373 Candidiasis of vulva and vagina: Secondary | ICD-10-CM

## 2020-04-06 DIAGNOSIS — B3731 Acute candidiasis of vulva and vagina: Secondary | ICD-10-CM

## 2020-04-06 MED ORDER — FLUCONAZOLE 150 MG PO TABS: 150.0000 mg | ORAL_TABLET | Freq: Once | ORAL | 0 refills | Status: AC

## 2020-04-12 DIAGNOSIS — Z20828 Contact with and (suspected) exposure to other viral communicable diseases: Secondary | ICD-10-CM | POA: Diagnosis not present

## 2020-05-02 ENCOUNTER — Ambulatory Visit: Payer: BC Managed Care – PPO | Admitting: Neurology

## 2020-05-04 DIAGNOSIS — Z1152 Encounter for screening for COVID-19: Secondary | ICD-10-CM | POA: Diagnosis not present

## 2020-05-30 DIAGNOSIS — Z20828 Contact with and (suspected) exposure to other viral communicable diseases: Secondary | ICD-10-CM | POA: Diagnosis not present

## 2020-08-24 DIAGNOSIS — Z20828 Contact with and (suspected) exposure to other viral communicable diseases: Secondary | ICD-10-CM | POA: Diagnosis not present

## 2020-08-24 DIAGNOSIS — R519 Headache, unspecified: Secondary | ICD-10-CM | POA: Diagnosis not present

## 2020-08-24 DIAGNOSIS — R059 Cough, unspecified: Secondary | ICD-10-CM | POA: Diagnosis not present

## 2020-08-24 DIAGNOSIS — R0981 Nasal congestion: Secondary | ICD-10-CM | POA: Diagnosis not present

## 2021-02-06 IMAGING — US US PELVIS COMPLETE TRANSABD/TRANSVAG W DUPLEX
1 series · 13 of 25 positions shown · non-contrast
Comparison: None.

CLINICAL DATA: 23 y/o  F; left lower quadrant pain for 1 week.

EXAM:
TRANSABDOMINAL AND TRANSVAGINAL ULTRASOUND OF PELVIS
DOPPLER ULTRASOUND OF OVARIES
TECHNIQUE: Both transabdominal and transvaginal ultrasound examinations of the
pelvis were performed. Transabdominal technique was performed for
global imaging of the pelvis including uterus, ovaries, adnexal
regions, and pelvic cul-de-sac.
It was necessary to proceed with endovaginal exam following the
transabdominal exam to visualize the adnexa. Color and duplex
Doppler ultrasound was utilized to evaluate blood flow to the
ovaries.

[Series 1: us pelvis complete transabd/transvag w duplex · 13 of 69 slices shown]
[im 1/69]
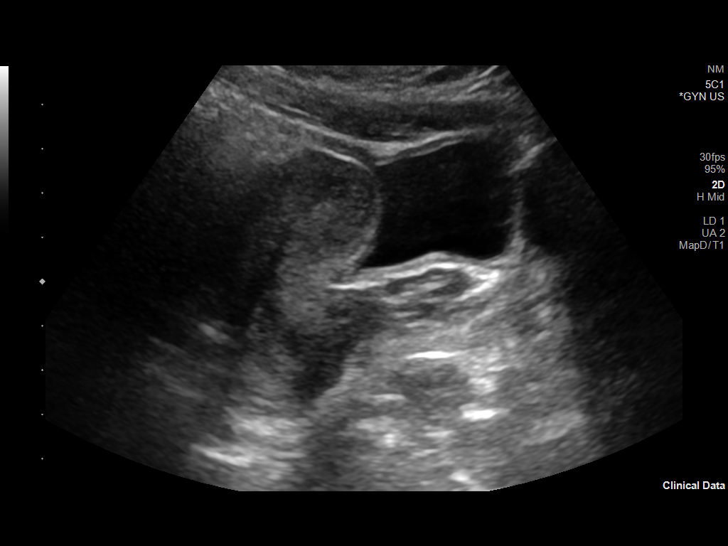
[im 6/69]
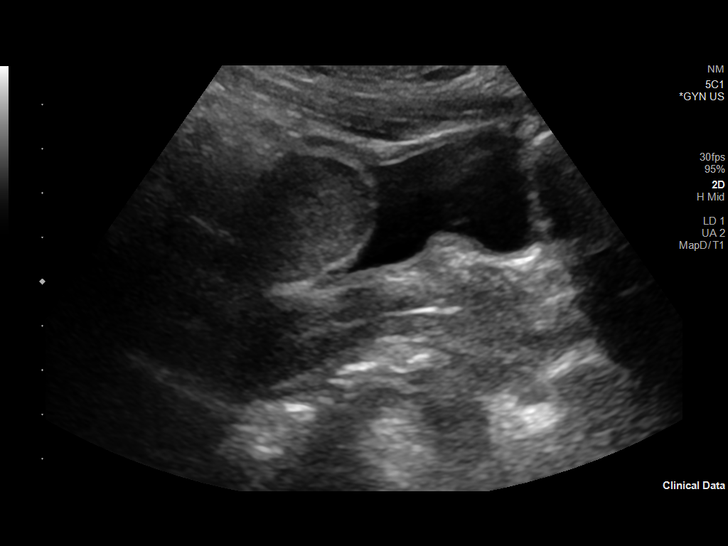
[im 12/69]
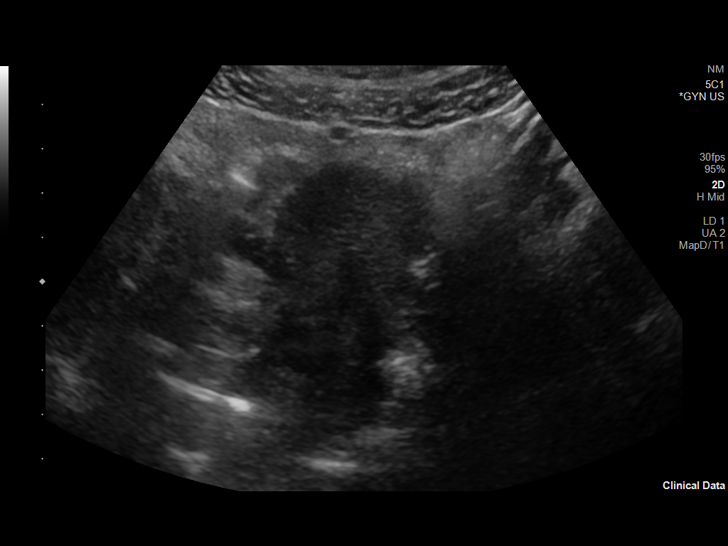
[im 18/69]
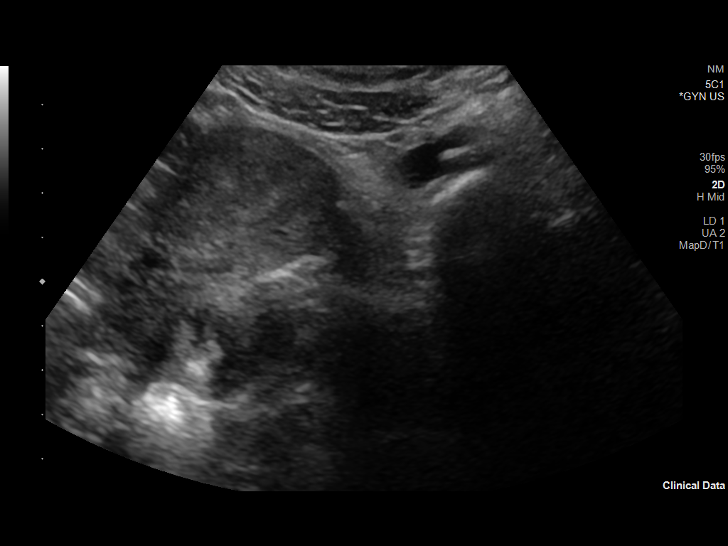
[im 23/69]
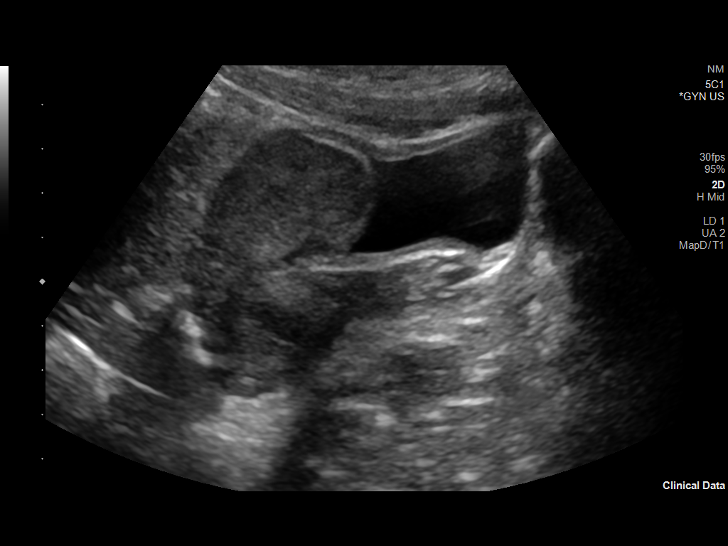
[im 29/69]
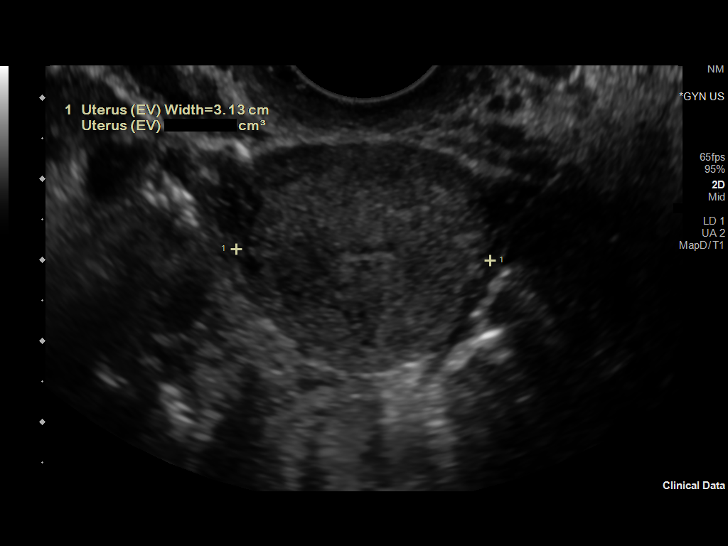
[im 35/69]
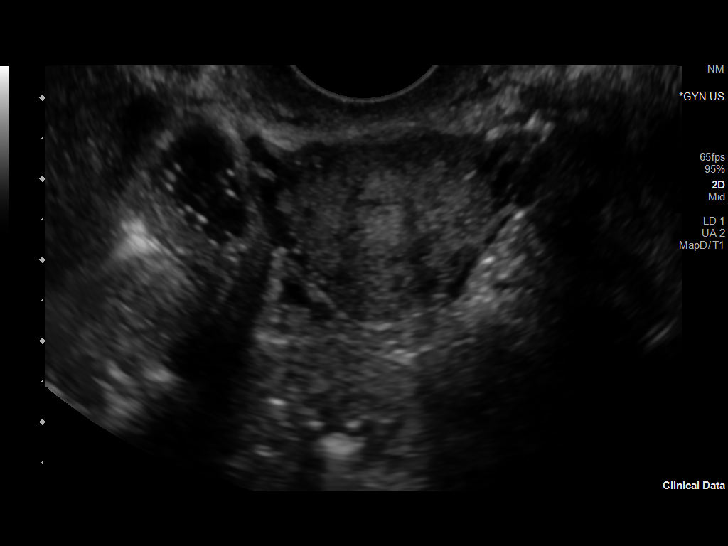
[im 40/69]
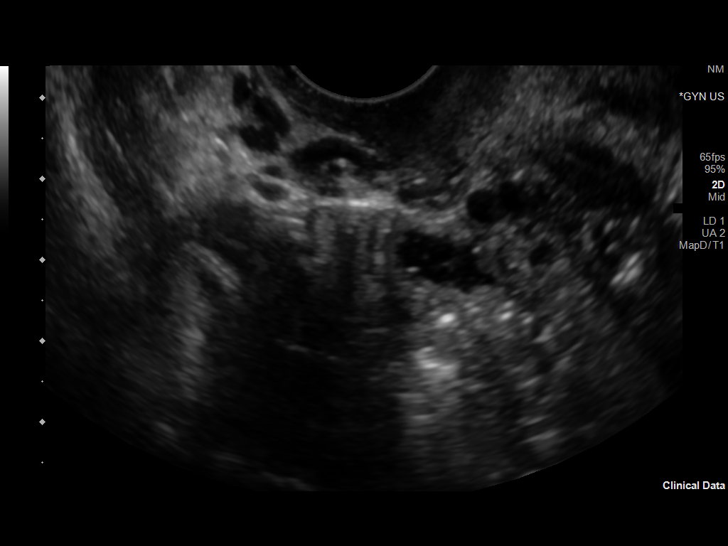
[im 46/69]
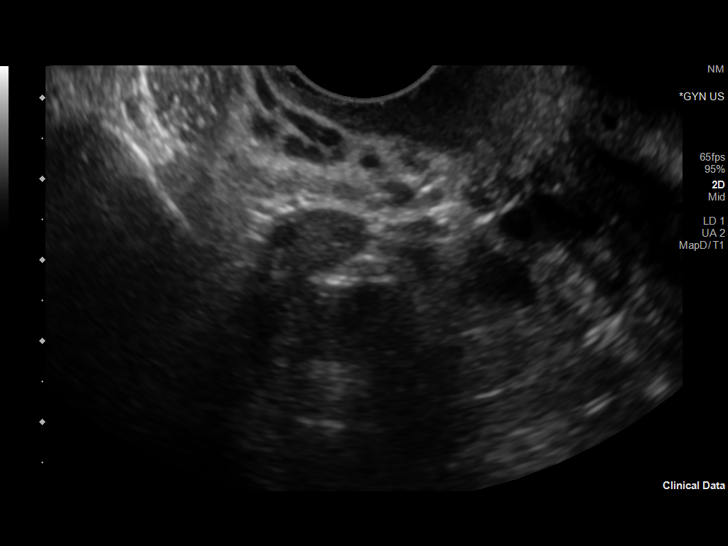
[im 52/69]
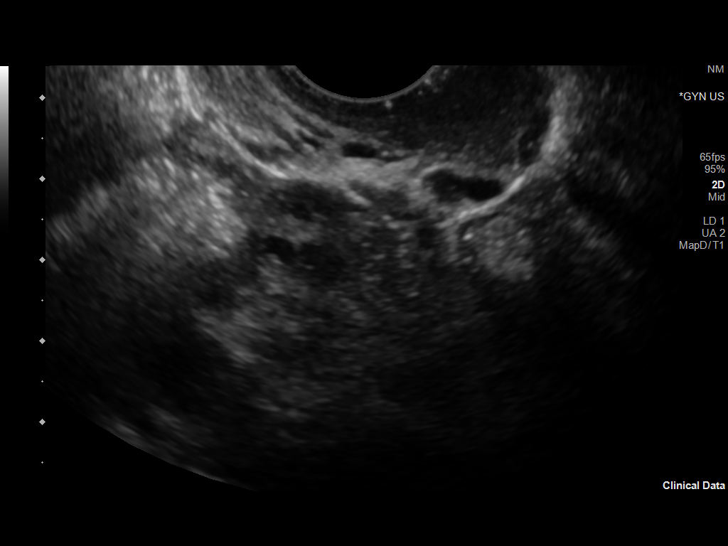
[im 57/69]
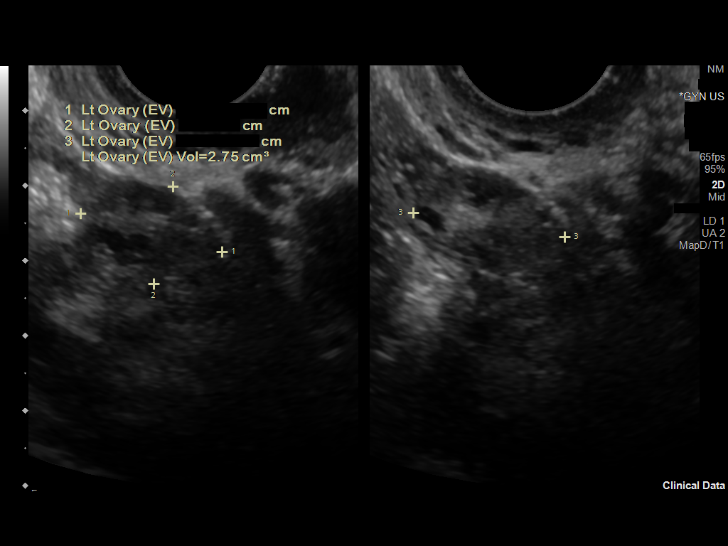
[im 63/69]
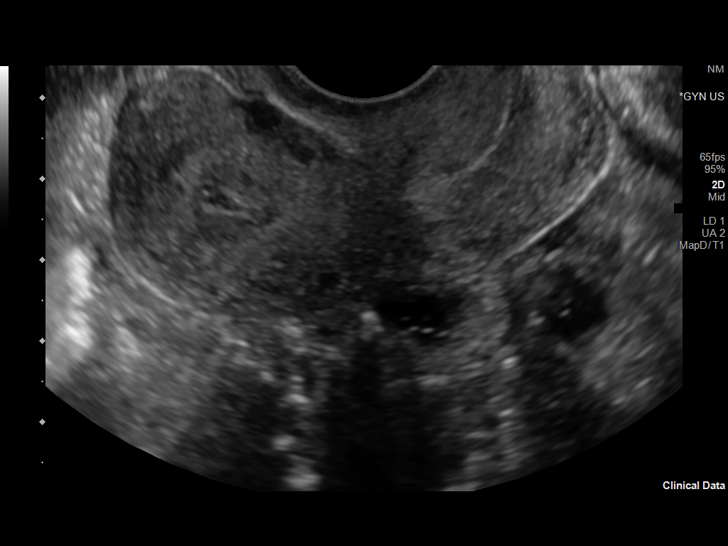
[im 69/69]
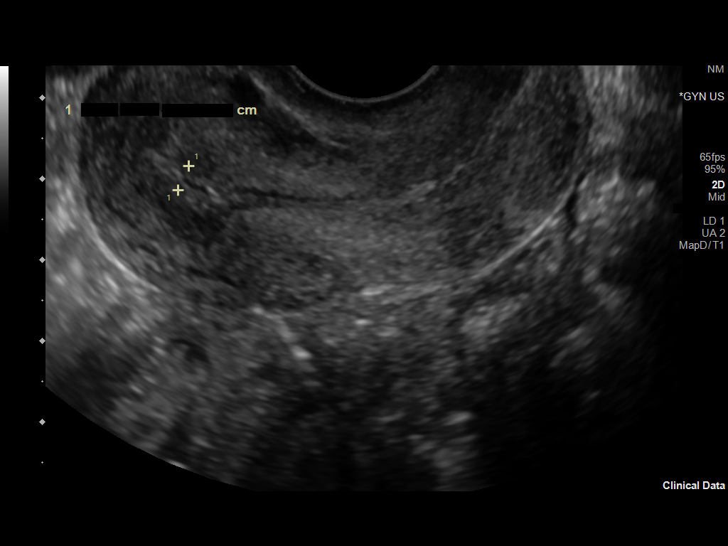

[13 of 25 positions shown; findings below may reference images not displayed]

FINDINGS: Uterus

Measurements: 6.2 x 2.9 x 3.2 cm = volume: 29.6 mL. No fibroids or
other mass visualized.

Endometrium

Thickness: 3.3.  No focal abnormality visualized.

Right ovary

Measurements: 2.3 x 1.7 x 1.8 cm = volume: 3.7 mL. Normal
appearance/no adnexal mass.

Left ovary

Measurements: 2.0 x 1.3 x 2.1 cm = volume: 2.8 mL. Normal
appearance/no adnexal mass.

Pulsed Doppler evaluation of both ovaries demonstrates normal
low-resistance arterial and venous waveforms.

Other findings

No abnormal free fluid.
IMPRESSION: Normal pelvic ultrasound.
